# Patient Record
Sex: Female | Born: 1985 | Race: White | Hispanic: No | Marital: Married | Smoking: Never smoker
Health system: Southern US, Community
[De-identification: ages and names within clinical notes are randomized; demographics above are authoritative.]

## PROBLEM LIST (undated history)

## (undated) ENCOUNTER — Inpatient Hospital Stay (HOSPITAL_COMMUNITY): Payer: Self-pay

## (undated) DIAGNOSIS — R87619 Unspecified abnormal cytological findings in specimens from cervix uteri: Secondary | ICD-10-CM

## (undated) DIAGNOSIS — R87629 Unspecified abnormal cytological findings in specimens from vagina: Secondary | ICD-10-CM

## (undated) DIAGNOSIS — K589 Irritable bowel syndrome without diarrhea: Secondary | ICD-10-CM

## (undated) DIAGNOSIS — F419 Anxiety disorder, unspecified: Secondary | ICD-10-CM

## (undated) DIAGNOSIS — O2620 Pregnancy care for patient with recurrent pregnancy loss, unspecified trimester: Secondary | ICD-10-CM

## (undated) HISTORY — DX: Pregnancy care for patient with recurrent pregnancy loss, unspecified trimester: O26.20

## (undated) HISTORY — DX: Anxiety disorder, unspecified: F41.9

## (undated) HISTORY — PX: WISDOM TOOTH EXTRACTION: SHX21

## (undated) HISTORY — DX: Unspecified abnormal cytological findings in specimens from cervix uteri: R87.619

## (undated) NOTE — H&P (Signed)
 Formatting of this note is different from the original. OB/GYN Labor and Delivery H&P  Patient Name: Tara Vargas [birth year] Medical Record Number: [id number] Date: 09/23/2022   Time: 23:21   Room/Bed: 3114/3114-1  Chief complaint:      Chief Complaint  Patient presents with  ? Cold/Flu-like Symptoms  ? Contractions  ? Nausea  ? Emesis During Pregnancy   HPI:     Joda Braatz is a 66 y.o. [id number] with an Estimated Date of Delivery: 11/25/22 at [redacted]w[redacted]d by us  presenting to triage for nausea/vomiting, uterine contractions, and fever. Pt states temp at home was 101. She took tylenol  but did vomit after. She also c/o uterine cramping. Reports family has had a stomach bug this week and had similar symptoms.   ROS:    Review of 10 organ systems negative except as noted as above  Prenatal care provider:  [facility]  OBHx:   Rh negative, Depression/anxiety, Elevated pregnancy BMI, IBS-C OB History  Gravida Para Term Preterm AB Living  6 2 2   3 2   SAB IAB Ectopic Molar Multiple Live Births  3         2   # Outcome Date GA Lbr Len/2nd Weight Sex Delivery Anes PTL Lv  6 Current           5 Term 05/07/15 [redacted]w[redacted]d  4423 g (9 lb 12 oz) M Vag-Spont None  LIV     Birth Comments: Water Birth  4 SAB 2015          3 SAB 2015          2 SAB 2014          1 Term 09/30/10 [redacted]w[redacted]d  4139 g (9 lb 2 oz) F Vag-Forceps EPI  LIV     Complications: Fourth degree perineal laceration   GynHx:     Problem list: There is no problem list on file for this patient.  PMHx:    Past Medical History:  Diagnosis Date  ? Anemia   ? Anxiety    PSHx:    Past Surgical History:  Procedure Laterality Date  ? WISDOM TOOTH EXTRACTION     Meds:    No current facility-administered medications on file prior to encounter.   Current Outpatient Medications on File Prior to Encounter  Medication Sig Dispense Refill  ? doxylamine succinate (UNISOM, DOXYLAMINE, ORAL) Take 0.5 tablets by mouth daily.    ?  ferrous sulfate 325 (65 FE) MG tablet Take 325 mg by mouth daily with breakfast.    ? PNV cmb#95-ferrous fumarate-FA (PRENAVITE) 28 mg iron- 800 mcg Tab Take by mouth daily.    ? pyridoxine, vitamin B6, (B-6) 25 MG tablet Take 25 mg by mouth daily.    ? venlafaxine er (EFFEXOR-XR) 150 MG 24 hr capsule Take 150 mg by mouth daily.     Allergies:    Allergies  Allergen Reactions  ? Amoxicillin Hives  ? Morphine Other (See Comments)    Childhood reaction   FamHx:   No family history on file.  SocHx:    Social History   Tobacco Use  ? Smoking status: Never  ? Smokeless tobacco: Not on file  Substance Use Topics  ? Alcohol use: Not Currently   Immunization Hx:  Immunization History  Administered Date(s) Administered  ? Rho (D) Immune Globulin  04/13/2022   OBJECTIVE:    BP 106/58   Pulse 103   Temp 98.5 F (36.9 C) (Oral)   Resp 16  There is no height or weight on file to calculate BMI.  Gen-     NAD, AAO HEENT-   [place]/AT CV-   Regular rate PULM-   Non-labored ABD-   Soft, gravid, non-tender, non-distended Ext-   No lower extremity edema Neuro-   GU-   Normal appearing external genitalia without lesions  Sterile Speculum Exam: Vaginal mucosa pink without lesions, cervix without lesions Cervical Exam:   closed  OB Exam FHTs- CAT 1 TOCO- q3-4 mins mild  Prenatal Labs:    ABO/Rh (External)  Date Value Ref Range Status  06/01/2022 O Negative  Final   Rubella Immune Status - External  Date Value Ref Range Status  06/01/2022 Immune  Final   RPR - External  Date Value Ref Range Status  06/01/2022 Nonreactive  Final   Hepatitis B Screen - External  Date Value Ref Range Status  06/01/2022 Nonreactive  Final   HIV Antibody - External  Date Value Ref Range Status  06/01/2022 Nonreactive  Final   [facility] GBS Result:  Recent Procedure Details   No resulted procedures found.   Radiology:   Labs:   Preeclampsia Panel    A/P:    [id number] [redacted]w[redacted]d  Nausea/vomiting Afebrile on admission. Uterine irritability noted. Cervix closed.  Start LR bolus, preE panel, zofran  for n/v Disposition: Discharge home with PTL precautions and fmc. F/U in office as scheduled. Encouraged PO fluids.   D/w Dr. Dow  Risks, benefits, alternatives and possible complications have been discussed in detail with the patient. admission,  procedures and expectations were discussed in detail.   Electronically signed: POWELL JINNY BLUSH, CNM 09/23/2022 23:21   Electronically signed by Nat GORMAN Dow, MD at 09/24/2022  6:35 AM EDT

---
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
## 2004-01-24 ENCOUNTER — Other Ambulatory Visit: Admission: RE | Admit: 2004-01-24 | Discharge: 2004-01-24 | Payer: Self-pay | Admitting: Obstetrics and Gynecology

## 2004-05-29 ENCOUNTER — Emergency Department (HOSPITAL_COMMUNITY): Admission: EM | Admit: 2004-05-29 | Discharge: 2004-05-29 | Payer: Self-pay | Admitting: Emergency Medicine

## 2005-02-03 ENCOUNTER — Other Ambulatory Visit: Admission: RE | Admit: 2005-02-03 | Discharge: 2005-02-03 | Payer: Self-pay | Admitting: Obstetrics and Gynecology

## 2005-08-10 ENCOUNTER — Emergency Department (HOSPITAL_COMMUNITY): Admission: EM | Admit: 2005-08-10 | Discharge: 2005-08-10 | Payer: Self-pay | Admitting: Emergency Medicine

## 2005-11-16 IMAGING — CT CT RECONSTRUCTION
4 of 5 series · 16 of 33 positions shown, 18 images · IV contrast (agent unspecified)
Comparison: none

CLINICAL DATA: Fell down steps. Please evaluate T-8 and T-9 fractures.
TECHNIQUE: Axial scans were obtained from the level of the superior border of T-6 through the T-10 pedicular level.  Also, sagittal and coronal reformations were performed. 
 CT THORACIC SPINE LIMITED WITHOUT CONTRAST:
 There are superior end plate compression fractures associated with the T-8 and T-9 vertebral bodies with approximately 10 to 15 percent depression of the superior end plates of the T-8 and T-9 vertebrae.  
 There is no evidence of posterior element fractures and there is no evidence for a burst-type fracture.  The bony spinal canal has a normal appearance.  The intervertebral discs are not well evaluated on this standard CT of the thoracic spine.  There are no destructive changes.

[Series 3: recon 2: · axial · 0.33mm/px · z∈[-191,-135]mm · 4 of 75 slices shown, 5 images]
[im 15/75  soft-tissue]
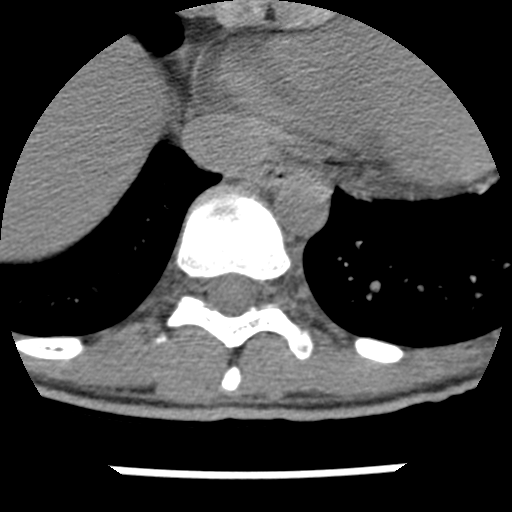
[im 15/75  bone]
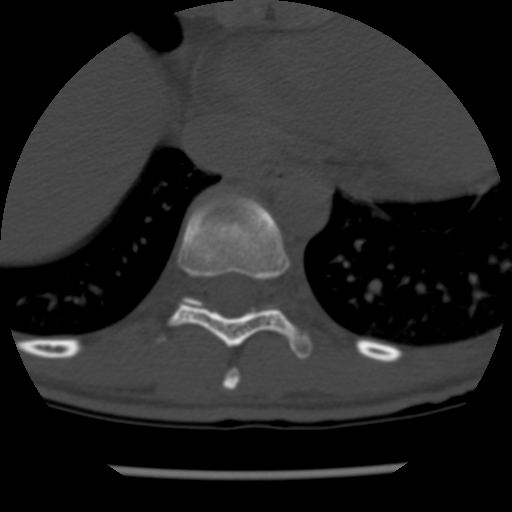
[im 30/75  bone]
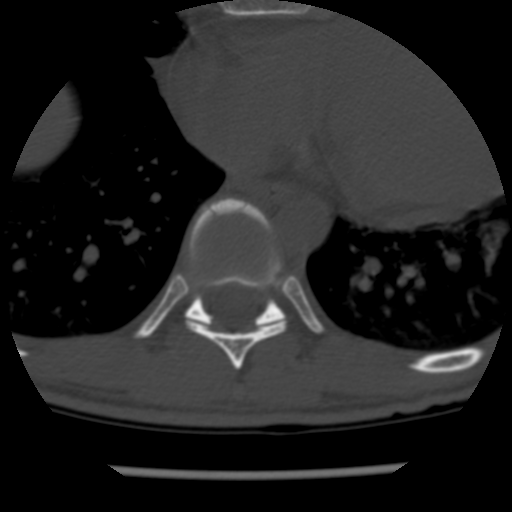
[im 45/75  bone]
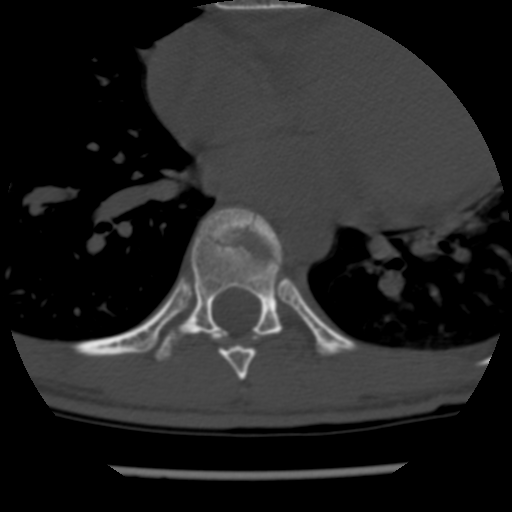
[im 60/75  bone]
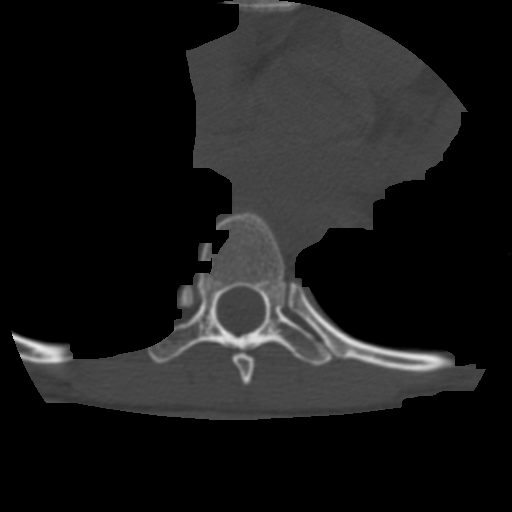

[Series 102: — · axial · 0.33mm/px · z∈[-191,-135]mm · 4 of 76 slices shown]
[im 16/76  bone]
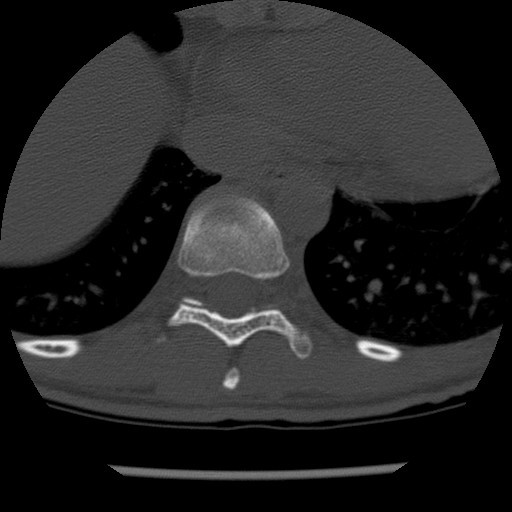
[im 31/76  bone]
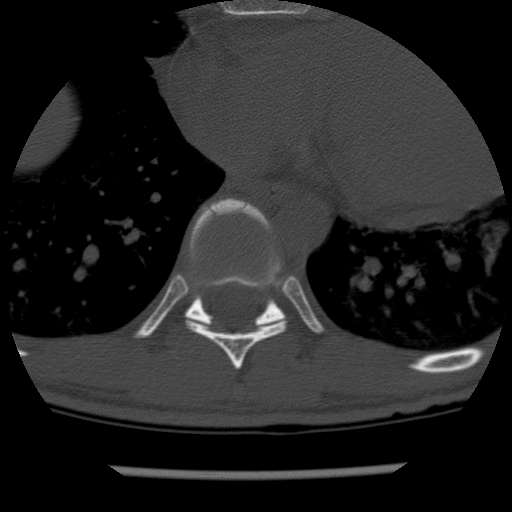
[im 46/76  bone]
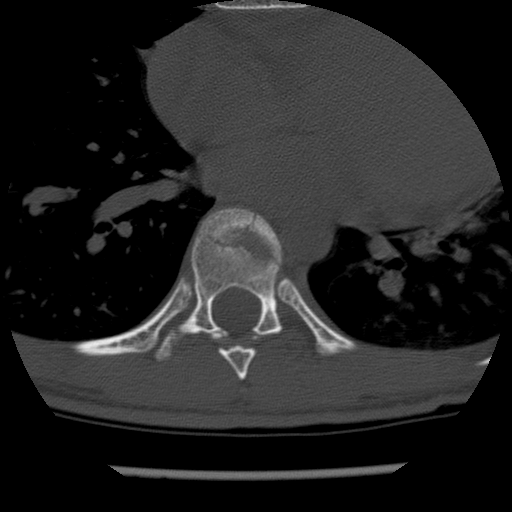
[im 61/76  bone]
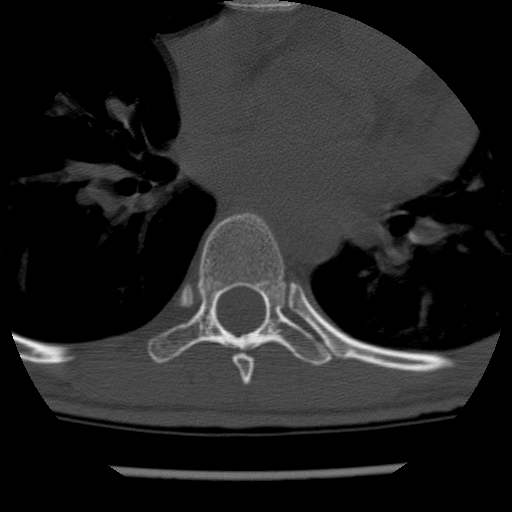

[Series 103: reformatted · sagittal · 0.33mm/px · 3 of 33 slices shown (1 of 2)]
[im 7/33  bone]
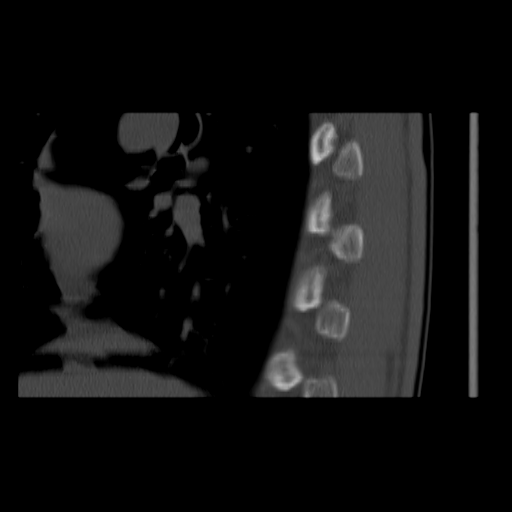
[im 13/33  bone]
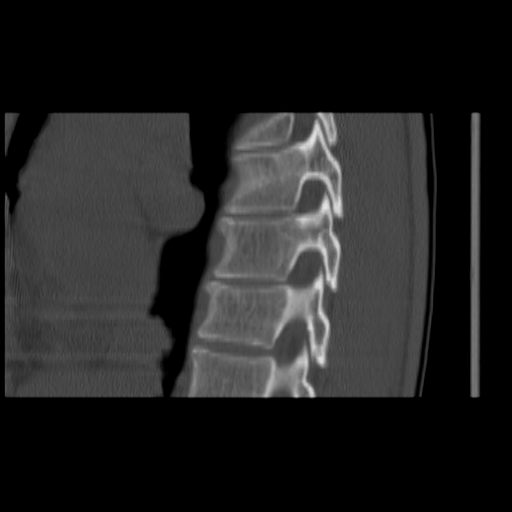
[im 20/33  bone]
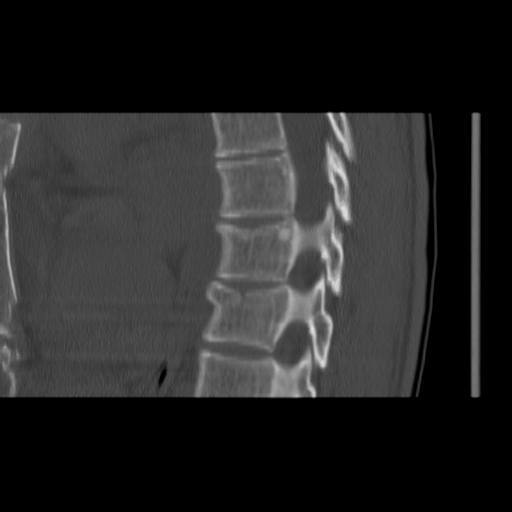

[Series 104: reformatted · coronal · 0.33mm/px · 5 of 33 slices shown, 6 images (2 of 2)]
[im 11/33  bone]
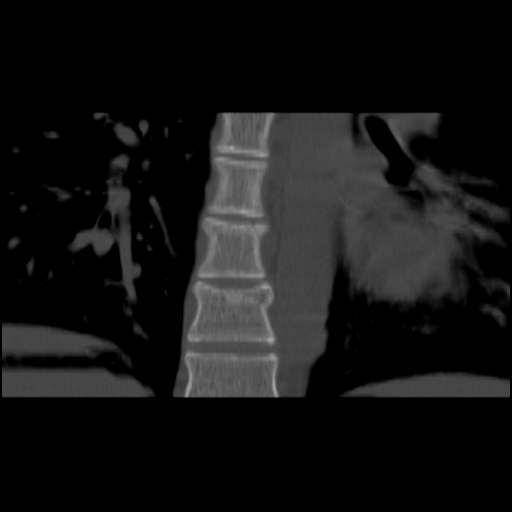
[im 14/33  bone]
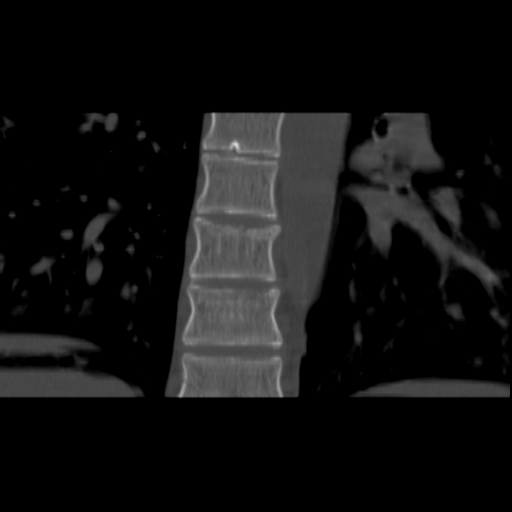
[im 17/33  soft-tissue]
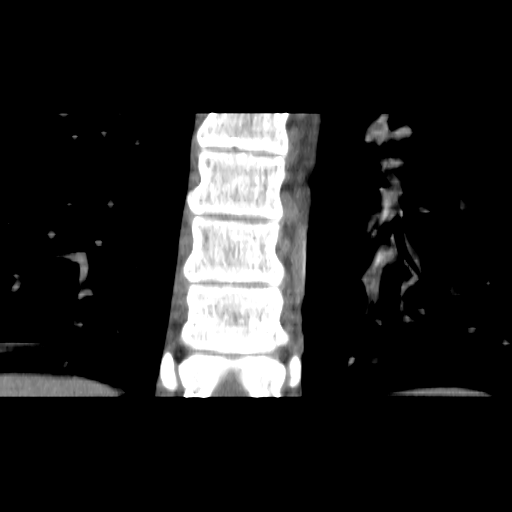
[im 17/33  bone]
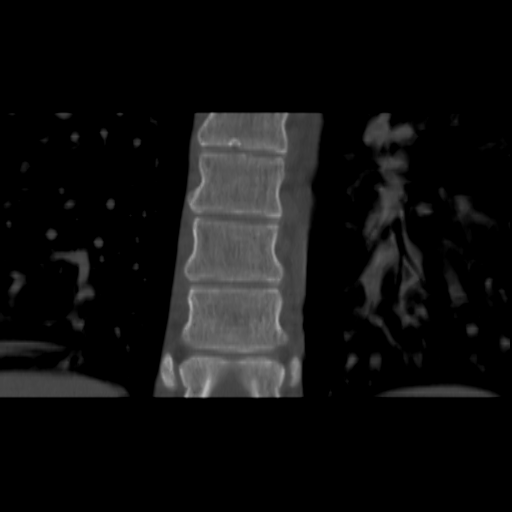
[im 19/33  bone]
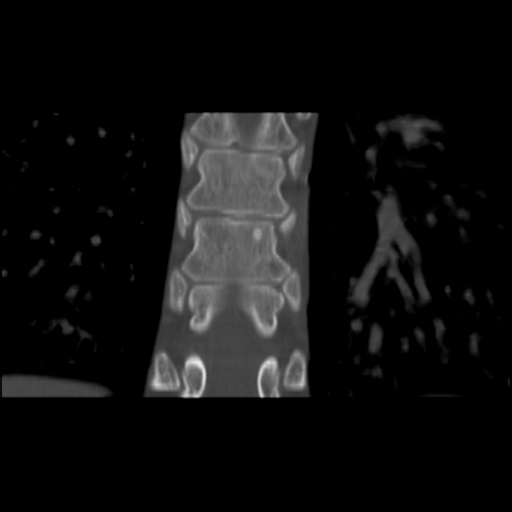
[im 22/33  bone]
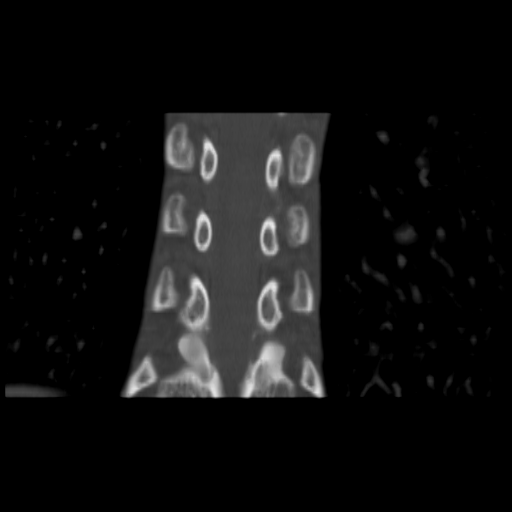

[16 of 33 positions shown; findings below may reference images not displayed]

IMPRESSION: Superior end plate compression fractures associated with the T-8 and T-9 vertebral bodies as discussed above.  No evidence for a burst-type fracture or a posterior element fractures.  The intervertebral discs are not well evaluated on this CT study (MR would be more sensitive for disc herniation).
 CT MULTIPLANAR RECONSTRUCTION OF THE THORACIC SPINE:
 Multiplanar reformatted CT images were reconstructed from the axial CT data set. These images were reviewed and pertinent findings are included in the accompanying complete CT report.
IMPRESSION: See complete CT report.

## 2006-02-04 ENCOUNTER — Other Ambulatory Visit: Admission: RE | Admit: 2006-02-04 | Discharge: 2006-02-04 | Payer: Self-pay | Admitting: Obstetrics & Gynecology

## 2007-02-07 ENCOUNTER — Other Ambulatory Visit: Admission: RE | Admit: 2007-02-07 | Discharge: 2007-02-07 | Payer: Self-pay | Admitting: Obstetrics & Gynecology

## 2008-09-03 ENCOUNTER — Other Ambulatory Visit: Admission: RE | Admit: 2008-09-03 | Discharge: 2008-09-03 | Payer: Self-pay | Admitting: Obstetrics and Gynecology

## 2009-01-02 ENCOUNTER — Emergency Department (HOSPITAL_COMMUNITY): Admission: EM | Admit: 2009-01-02 | Discharge: 2009-01-02 | Payer: Self-pay | Admitting: Emergency Medicine

## 2010-03-28 LAB — US OB COMP LESS 14 WKS: CRL: 6.49 cm

## 2010-05-12 LAB — US OB TRANSVAGINAL

## 2010-07-02 LAB — US OB TRANSVAGINAL

## 2010-08-18 ENCOUNTER — Inpatient Hospital Stay (HOSPITAL_COMMUNITY)
Admission: AD | Admit: 2010-08-18 | Discharge: 2010-08-18 | Disposition: A | Discharge status: Home / Self Care | Payer: BC Managed Care – PPO | Source: Ambulatory Visit | Attending: Obstetrics and Gynecology | Admitting: Obstetrics and Gynecology

## 2010-08-18 DIAGNOSIS — O9989 Other specified diseases and conditions complicating pregnancy, childbirth and the puerperium: Secondary | ICD-10-CM

## 2010-08-18 DIAGNOSIS — R51 Headache: Secondary | ICD-10-CM

## 2010-08-18 DIAGNOSIS — O99891 Other specified diseases and conditions complicating pregnancy: Secondary | ICD-10-CM | POA: Insufficient documentation

## 2010-08-18 LAB — URINALYSIS, ROUTINE W REFLEX MICROSCOPIC
Bilirubin Urine: NEGATIVE
Glucose, UA: NEGATIVE mg/dL
Hgb urine dipstick: NEGATIVE
Ketones, ur: NEGATIVE mg/dL
Nitrite: NEGATIVE
Protein, ur: NEGATIVE mg/dL
Specific Gravity, Urine: <1.005 — ABNORMAL LOW (ref 1.005–1.030)
Urobilinogen, UA: 0.2 mg/dL (ref 0.0–1.0)
pH: 6 (ref 5.0–8.0)

## 2010-09-21 LAB — POCT I-STAT, CHEM 8
BUN: 10 mg/dL (ref 6–23)
Calcium, Ion: 1.11 mmol/L — ABNORMAL LOW (ref 1.12–1.32)
Chloride: 104 mEq/L (ref 96–112)
Creatinine, Ser: 1 mg/dL (ref 0.4–1.2)
Glucose, Bld: 81 mg/dL (ref 70–99)
HCT: 43 % (ref 36.0–46.0)
Hemoglobin: 14.6 g/dL (ref 12.0–15.0)
Potassium: 3.8 mEq/L (ref 3.5–5.1)
Sodium: 138 mEq/L (ref 135–145)
TCO2: 22 mmol/L (ref 0–100)

## 2010-09-29 ENCOUNTER — Inpatient Hospital Stay (HOSPITAL_COMMUNITY)
Admission: AD | Admit: 2010-09-29 | Discharge: 2010-10-02 | DRG: 372 | Disposition: A | Discharge status: Home / Self Care | Payer: BC Managed Care – PPO | Source: Ambulatory Visit | Attending: Obstetrics & Gynecology | Admitting: Obstetrics & Gynecology

## 2010-09-29 LAB — CBC
HCT: 37.5 % (ref 36.0–46.0)
Hemoglobin: 12.5 g/dL (ref 12.0–15.0)
MCH: 31.1 pg (ref 26.0–34.0)
MCHC: 33.3 g/dL (ref 30.0–36.0)
MCV: 93.3 fL (ref 78.0–100.0)
Platelets: 273 10*3/uL (ref 150–400)
RBC: 4.02 MIL/uL (ref 3.87–5.11)
RDW: 14.2 % (ref 11.5–15.5)
WBC: 10.1 10*3/uL (ref 4.0–10.5)

## 2010-09-29 LAB — RPR: RPR Ser Ql: NONREACTIVE

## 2010-09-30 LAB — ABO/RH: ABO/RH(D): O NEG

## 2010-10-01 LAB — CBC
HCT: 34 % — ABNORMAL LOW (ref 36.0–46.0)
Hemoglobin: 10.9 g/dL — ABNORMAL LOW (ref 12.0–15.0)
MCH: 30.4 pg (ref 26.0–34.0)
MCHC: 32.1 g/dL (ref 30.0–36.0)
MCV: 95 fL (ref 78.0–100.0)
Platelets: 279 10*3/uL (ref 150–400)
RBC: 3.58 MIL/uL — ABNORMAL LOW (ref 3.87–5.11)
RDW: 14.5 % (ref 11.5–15.5)
WBC: 28.8 10*3/uL — ABNORMAL HIGH (ref 4.0–10.5)

## 2010-10-02 LAB — CBC
HCT: 28.7 % — ABNORMAL LOW (ref 36.0–46.0)
Hemoglobin: 9.3 g/dL — ABNORMAL LOW (ref 12.0–15.0)
MCH: 31.1 pg (ref 26.0–34.0)
MCHC: 32.4 g/dL (ref 30.0–36.0)
MCV: 96 fL (ref 78.0–100.0)
Platelets: 269 10*3/uL (ref 150–400)
RBC: 2.99 MIL/uL — ABNORMAL LOW (ref 3.87–5.11)
RDW: 14.5 % (ref 11.5–15.5)
WBC: 15.6 10*3/uL — ABNORMAL HIGH (ref 4.0–10.5)

## 2010-10-10 NOTE — Discharge Summary (Signed)
  [name], [name]              ACCOUNT NO.:  [id number]  MEDICAL RECORD NO.:  [id number]           PATIENT TYPE:  I  LOCATION:  9144                          FACILITY:  [facility]  PHYSICIAN:  Kestrel Mis H. Tenny Craw, MD     DATE OF BIRTH:  [birth year]  DATE OF ADMISSION:  09/29/2010 DATE OF DISCHARGE:  10/02/2010                              DISCHARGE SUMMARY   HOSPITAL DIAGNOSES: 1. A 39-week intrauterine pregnancy with spontaneous rupture of     membranes. 2. Low forceps assisted vaginal delivery of a female infant. 3. Fourth-degree perineal laceration.  HOSPITAL COURSE:  Ms. Tara Vargas is a 25 year old gravida 1, para 0 who presented at 44 weeks' estimated gestational age with spontaneous rupture of membranes.  Her prenatal course had been uncomplicated.  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  The MRI report was reviewed by Anesthesia prior to admission and she was felt to be suitable candidate for regional anesthesia.  She presented early on September 29, 2010, with spontaneous rupture of membranes for clear fluid. She was not contracting very frequently on her own and augmentation of labor was deemed necessary.  However, she preferred to avoid Pitocin and therefore the patient was allowed to proceed with nipple stimulation for augmentation of labor.  However, she did eventually require Pitocin augmentation.  The patient made it to complete and +2 station after pushing for 3 hours.  She, after an hour of pushing, did develop a temperature and did receive ampicillin.  After 3 hours of pushing, she did undergo a forceps-assisted vaginal delivery by Dr. Dareen Piano for delivery of a vigorous female infant, weighing 4105 g and 9 pounds 0 ounces with Apgar scores of 7 at 1 minute and 9 at 5 minutes.  She did experience a fourth-degree perineal laceration which was repaired in the usual fashion.  Postpartum, she did very well and by postpartum day #2, she was deemed ready for  discharge home.  She was receiving Ultram for pain medication.  She was taking Colace as a stool softener.  She was breast-feeding well.  She was instructed on perineal care for her fourth- degree laceration.  She will follow up in the office in 4 weeks.  DISCHARGE LABS:  White blood cell count 15.6, hemoglobin 9.3, platelets 269,000.     Freddrick March. Tenny Craw, MD     [facility]/MEDQ  D:  10/02/2010  T:  10/02/2010  Job:  [id number]  Electronically Signed by Waynard Reeds MD on 10/10/2010 10:46:56 AM

## 2013-09-27 ENCOUNTER — Encounter (HOSPITAL_COMMUNITY): Payer: Self-pay | Admitting: *Deleted

## 2013-09-27 ENCOUNTER — Inpatient Hospital Stay (HOSPITAL_COMMUNITY)
Admission: AD | Admit: 2013-09-27 | Discharge: 2013-09-27 | Disposition: A | Discharge status: Home / Self Care | Payer: BC Managed Care – PPO | Source: Ambulatory Visit | Attending: Obstetrics and Gynecology | Admitting: Obstetrics and Gynecology

## 2013-09-27 DIAGNOSIS — R109 Unspecified abdominal pain: Secondary | ICD-10-CM | POA: Insufficient documentation

## 2013-09-27 DIAGNOSIS — D649 Anemia, unspecified: Secondary | ICD-10-CM | POA: Insufficient documentation

## 2013-09-27 DIAGNOSIS — O99019 Anemia complicating pregnancy, unspecified trimester: Secondary | ICD-10-CM | POA: Insufficient documentation

## 2013-09-27 DIAGNOSIS — O200 Threatened abortion: Secondary | ICD-10-CM | POA: Insufficient documentation

## 2013-09-27 LAB — URINALYSIS, ROUTINE W REFLEX MICROSCOPIC
Bilirubin Urine: NEGATIVE
Glucose, UA: NEGATIVE mg/dL
Hgb urine dipstick: NEGATIVE
Ketones, ur: NEGATIVE mg/dL
Leukocytes, UA: NEGATIVE
Nitrite: NEGATIVE
Protein, ur: NEGATIVE mg/dL
Specific Gravity, Urine: <1.005 — ABNORMAL LOW (ref 1.005–1.030)
Urobilinogen, UA: 0.2 mg/dL (ref 0.0–1.0)
pH: 7.5 (ref 5.0–8.0)

## 2013-09-27 LAB — WET PREP, GENITAL
Clue Cells Wet Prep HPF POC: NONE SEEN
Trich, Wet Prep: NONE SEEN
Yeast Wet Prep HPF POC: NONE SEEN

## 2013-09-27 LAB — CBC
HEMATOCRIT: 39.2 % (ref 36.0–46.0)
Hemoglobin: 13.5 g/dL (ref 12.0–15.0)
MCH: 31.9 pg (ref 26.0–34.0)
MCHC: 34.4 g/dL (ref 30.0–36.0)
MCV: 92.7 fL (ref 78.0–100.0)
Platelets: 236 10*3/uL (ref 150–400)
RBC: 4.23 MIL/uL (ref 3.87–5.11)
RDW: 12.7 % (ref 11.5–15.5)
WBC: 6.6 10*3/uL (ref 4.0–10.5)

## 2013-09-27 LAB — HCG, QUANTITATIVE, PREGNANCY: hCG, Beta Chain, Quant, S: 19 m[IU]/mL — ABNORMAL HIGH (ref <5)

## 2013-09-27 NOTE — Discharge Instructions (Signed)
Threatened Miscarriage   A threatened miscarriage is a pregnancy that may end. It may be marked by bleeding during the first 20 weeks of pregnancy. Often, the pregnancy can continue without any more problems. You may be asked to stop:  · Having sex (intercourse).  · Having orgasms.  · Using tampons.  · Exercising.  · Doing heavy physical activity and work.  HOME CARE   · Your doctor may tell you to take bed rest and to stop activities and work.  · Write down the number of pads you use each day. Write down how often you change pads. Write down how soaked they are.  · Follow your doctor's advice for follow-up visits and tests.  · If your blood type is Rh-negative and the father's blood is Rh-positive (or is not known), you may get a shot to protect the baby.  · If you have a miscarriage, save all the tissue you pass in a container. Take the container to your doctor.  GET HELP RIGHT AWAY IF:   · You have bad cramps or pain in your belly (abdomen), lower belly, or back.  · You have a fever or chills.  · Your bleeding gets worse or you pass large clots of blood or tissue. Save this tissue to show your doctor.  · You feel lightheaded, weak, dizzy, or pass out (faint).  · You have a gush of fluid from your vagina.  MAKE SURE YOU:   · Understand these instructions.  · Will watch your condition.  · Will get help right away if you are not doing well or get worse.  Document Released: 05/14/2008 Document Revised: 08/24/2011 Document Reviewed: 06/17/2009  ExitCare® Patient Information ©2014 [employer].

## 2013-09-27 NOTE — MAU Note (Signed)
Hx of SAB x2, very tearful.  Has been cramping past few days.  Started spotting this morning.  First OB appt next Tues.

## 2013-09-27 NOTE — MAU Provider Note (Signed)
History     CSN: [id number]  Arrival date and time: 09/27/13 1034   None     Chief Complaint  Patient presents with  . Abdominal Pain  . Vaginal Bleeding   HPI Comments: Tara Vargas 28 y.o. 3060w2d [id number] presents to MAU with spotting started today and cramping for 4 days. Her pain is "4" on 1-10 scale. No recent intercourse. She has had 2 SABs in past. She had a 5 day menses starting March 2, and some irregular bleeding since then. She has a NOB visit planned with Dr Dareen PianoAnderson.      Past Medical History  Diagnosis Date  . Anemia     Past Surgical History  Procedure Laterality Date  . No past surgeries      Family History  Problem Relation Age of Onset  . Diabetes Mother   . Diabetes Father     History  Substance Use Topics  . Smoking status: Never Smoker   . Smokeless tobacco: Not on file  . Alcohol Use: No    Allergies:  Allergies  Allergen Reactions  . Amoxicillin Hives  . Morphine And Related Hives    Prescriptions prior to admission  Medication Sig Dispense Refill  . acetaminophen (TYLENOL) 325 MG tablet Take 650 mg by mouth every 6 (six) hours as needed for headache.      . calcium carbonate (TUMS - DOSED IN MG ELEMENTAL CALCIUM) 500 MG chewable tablet Chew 1 tablet by mouth as needed for indigestion or heartburn.      . Prenatal Vit-Fe Fumarate-FA (PRENATAL MULTIVITAMIN) TABS tablet Take 1 tablet by mouth daily at 12 noon.        Review of Systems  Constitutional: Negative.        Tearful  HENT: Negative.   Eyes: Negative.   Respiratory: Negative.   Cardiovascular: Negative.   Gastrointestinal: Positive for abdominal pain.  Genitourinary: Negative.        Spotting when wipes today only  Musculoskeletal: Negative.   Skin: Negative.   Neurological: Negative.   Psychiatric/Behavioral: Negative.    Physical Exam   Blood pressure 127/78, pulse 77, temperature 98.6 F (37 C), temperature source Oral, resp. rate 18, height 5' 4.5" (1.638  m), weight 76.204 kg (168 lb), last menstrual period 08/14/2013.  Physical Exam  Constitutional: She is oriented to person, place, and time. She appears well-developed and well-nourished. No distress.  HENT:  Head: Normocephalic and atraumatic.  Eyes: Pupils are equal, round, and reactive to light.  GI: Soft. She exhibits no distension and no mass. There is no tenderness. There is no rebound and no guarding.  Genitourinary:  Genital:external negative Vaginal:no bleeding, small amount white discharge Cervix:few little blood dots on cervix/ friable Bimanual:nontender   Musculoskeletal: Normal range of motion.  Neurological: She is alert and oriented to person, place, and time.  Skin: Skin is warm and dry.  Psychiatric: She has a normal mood and affect. Her behavior is normal. Judgment and thought content normal.   Results for orders placed during the hospital encounter of 09/27/13 (from the past 24 hour(s))  URINALYSIS, ROUTINE W REFLEX MICROSCOPIC     Status: Abnormal   Collection Time    09/27/13 10:48 AM      Result Value Ref Range   Color, Urine YELLOW  YELLOW   APPearance CLEAR  CLEAR   Specific Gravity, Urine <1.005 (*) 1.005 - 1.030   pH 7.5  5.0 - 8.0   Glucose, UA NEGATIVE  NEGATIVE  mg/dL   Hgb urine dipstick NEGATIVE  NEGATIVE   Bilirubin Urine NEGATIVE  NEGATIVE   Ketones, ur NEGATIVE  NEGATIVE mg/dL   Protein, ur NEGATIVE  NEGATIVE mg/dL   Urobilinogen, UA 0.2  0.0 - 1.0 mg/dL   Nitrite NEGATIVE  NEGATIVE   Leukocytes, UA NEGATIVE  NEGATIVE  HCG, QUANTITATIVE, PREGNANCY     Status: Abnormal   Collection Time    09/27/13 11:09 AM      Result Value Ref Range   hCG, Beta Chain, Quant, S 19 (*) <5 mIU/mL  WET PREP, GENITAL     Status: Abnormal   Collection Time    09/27/13 11:30 AM      Result Value Ref Range   Yeast Wet Prep HPF POC NONE SEEN  NONE SEEN   Trich, Wet Prep NONE SEEN  NONE SEEN   Clue Cells Wet Prep HPF POC NONE SEEN  NONE SEEN   WBC, Wet Prep HPF  POC FEW (*) NONE SEEN  CBC     Status: None   Collection Time    09/27/13 11:44 AM      Result Value Ref Range   WBC 6.6  4.0 - 10.5 K/uL   RBC 4.23  3.87 - 5.11 MIL/uL   Hemoglobin 13.5  12.0 - 15.0 g/dL   HCT 45.439.2  09.836.0 - 11.946.0 %   MCV 92.7  78.0 - 100.0 fL   MCH 31.9  26.0 - 34.0 pg   MCHC 34.4  30.0 - 36.0 g/dL   RDW 14.712.7  82.911.5 - 56.215.5 %   Platelets 236  150 - 400 K/uL   Blood Type O negative  MAU Course  Procedures  MDM  Called Dr Henderson CloudHorvath who advised repeat quant in 48 hours and if it is <19 give Rhogam. If it is >19 follow as early pregnancy  Assessment and Plan   A: Threatened Miscarriage Negative blood type  P: Miscarriage precautions Return to MAU in 48 hours for repeat quant if less than 19/ Give Rhogam Return to MAU if bleeding becomes worse before 2 days Call Dr Dareen PianoAnderson with other concerns  Delbert PhenixLinda M Medha Pippen 09/27/2013, 11:19 AM

## 2013-09-28 LAB — GC/CHLAMYDIA PROBE AMP
CT Probe RNA: NEGATIVE
GC Probe RNA: NEGATIVE

## 2013-09-29 ENCOUNTER — Inpatient Hospital Stay (HOSPITAL_COMMUNITY)
Admission: AD | Admit: 2013-09-29 | Discharge: 2013-09-29 | Disposition: A | Discharge status: Home / Self Care | Payer: BC Managed Care – PPO | Source: Ambulatory Visit | Attending: Obstetrics & Gynecology | Admitting: Obstetrics & Gynecology

## 2013-09-29 DIAGNOSIS — O039 Complete or unspecified spontaneous abortion without complication: Secondary | ICD-10-CM

## 2013-09-29 DIAGNOSIS — O200 Threatened abortion: Secondary | ICD-10-CM | POA: Insufficient documentation

## 2013-09-29 DIAGNOSIS — O360990 Maternal care for other rhesus isoimmunization, unspecified trimester, not applicable or unspecified: Secondary | ICD-10-CM | POA: Insufficient documentation

## 2013-09-29 LAB — HCG, QUANTITATIVE, PREGNANCY: hCG, Beta Chain, Quant, S: 6 m[IU]/mL — ABNORMAL HIGH (ref <5)

## 2013-09-29 MED ORDER — RHO D IMMUNE GLOBULIN 1500 UNIT/2ML IJ SOLN
300.0000 ug | Freq: Once | INTRAMUSCULAR | Status: AC
  Administered 2013-09-29: 300 ug via INTRAMUSCULAR
  Filled 2013-09-29: qty 2

## 2013-09-29 NOTE — Discharge Instructions (Signed)
Miscarriage  A miscarriage is the loss of an unborn baby (fetus) before the 20th week of pregnancy. The cause is often unknown.   HOME CARE  · You may need to stay in bed (bed rest), or you may be able to do light activity. Go about activity as told by your doctor.  · Have help at home.  · Write down how many pads you use each day. Write down how soaked they are.  · Do not use tampons. Do not wash out your vagina (douche) or have sex (intercourse) until your doctor approves.  · Only take medicine as told by your doctor.  · Do not take aspirin.  · Keep all doctor visits as told.  · If you or your partner have problems with grieving, talk to your doctor. You can also try counseling. Give yourself time to grieve before trying to get pregnant again.  GET HELP RIGHT AWAY IF:  · You have bad cramps or pain in your back or belly (abdomen).  · You have a fever.  · You pass large clumps of blood (clots) from your vagina that are walnut-sized or larger. Save the clumps for your doctor to see.  · You pass large amounts of tissue from your vagina. Save the tissue for your doctor to see.  · You have more bleeding.  · You have thick, bad-smelling fluid (discharge) coming from the vagina.  · You get lightheaded, weak, or you pass out (faint).  · You have chills.  MAKE SURE YOU:  · Understand these instructions.  · Will watch your condition.  · Will get help right away if you are not doing well or get worse.  Document Released: 08/24/2011 Document Reviewed: 08/24/2011  ExitCare® Patient Information ©2014 [employer].

## 2013-09-29 NOTE — MAU Provider Note (Signed)
Discussed case with CNM and I agree with above  Essie HartWalda Quintella Mura

## 2013-09-29 NOTE — MAU Provider Note (Signed)
History     CSN: [id number]  Arrival date and time: 09/29/13 0934   None     Chief Complaint  Patient presents with  . Follow-up   HPI This is a 28 y.o. female at 6212w4d who presents for followup Quant HCG. She was seen two days ago for bleeding and Quant was found to be 19. She was instructed to repeat Quant today. Has small to mod. bleedng but no cramping  Previous MAU Note: A: Threatened Miscarriage  Negative blood type  P: Miscarriage precautions  Return to MAU in 48 hours for repeat quant if less than 19/ Give Rhogam  Return to MAU if bleeding becomes worse before 2 days  Call Dr Dareen PianoAnderson with other concerns  OB History   Grav Para Term Preterm Abortions TAB SAB Ect Mult Living   4 1 1  2  2   1       Past Medical History  Diagnosis Date  . Anemia     Past Surgical History  Procedure Laterality Date  . No past surgeries      Family History  Problem Relation Age of Onset  . Diabetes Mother   . Diabetes Father     History  Substance Use Topics  . Smoking status: Never Smoker   . Smokeless tobacco: Not on file  . Alcohol Use: No    Allergies:  Allergies  Allergen Reactions  . Amoxicillin Hives  . Morphine And Related Hives    Prescriptions prior to admission  Medication Sig Dispense Refill  . acetaminophen (TYLENOL) 325 MG tablet Take 650 mg by mouth every 6 (six) hours as needed for headache.      . calcium carbonate (TUMS - DOSED IN MG ELEMENTAL CALCIUM) 500 MG chewable tablet Chew 1 tablet by mouth as needed for indigestion or heartburn.      . Prenatal Vit-Fe Fumarate-FA (PRENATAL MULTIVITAMIN) TABS tablet Take 1 tablet by mouth daily at 12 noon.        Review of Systems  Constitutional: Negative for fever, chills and malaise/fatigue.  Gastrointestinal: Negative for nausea, vomiting and abdominal pain.  Genitourinary:       Vaginal bleeding   Neurological: Negative for dizziness.  Psychiatric/Behavioral: The patient is nervous/anxious (Due  to multiple losses).    Physical Exam   Blood pressure 127/85, pulse 81, temperature 98.7 F (37.1 C), resp. rate 16, last menstrual period 08/14/2013, SpO2 100.00%.  Physical Exam  Constitutional: She is oriented to person, place, and time. She appears well-developed and well-nourished. No distress.  HENT:  Head: Normocephalic.  Cardiovascular: Normal rate.   Respiratory: Effort normal.  Genitourinary:  Exam not indicated  Musculoskeletal: Normal range of motion.  Neurological: She is alert and oriented to person, place, and time.  Skin: Skin is warm and dry.  Psychiatric: She has a normal mood and affect.    MAU Course  Procedures  MDM Results for orders placed during the hospital encounter of 09/29/13 (from the past 24 hour(s))  HCG, QUANTITATIVE, PREGNANCY     Status: Abnormal   Collection Time    09/29/13  9:45 AM      Result Value Ref Range   hCG, Beta Chain, Quant, S 6 (*) <5 mIU/mL  RH IG WORKUP (INCLUDES ABO/RH)     Status: None   Collection Time    09/29/13  9:45 AM      Result Value Ref Range   Gestational Age(Wks) 6     ABO/RH(D) O NEG  Antibody Screen NEG     Unit Number [id number]     Blood Component Type RHIG     Unit division 00     Status of Unit ISSUED     Transfusion Status OK TO TRANSFUSE       Assessment and Plan  A:  Inevitable SAB       Rh Negative  P:  Discussed with Dr Mora ApplPinn       Rhophylac today       Pt will call office for followup   Aviva SignsMarie L Delante Karapetyan 09/29/2013, 10:10 AM

## 2013-09-29 NOTE — MAU Note (Signed)
Patient to MAU for follow up BHCG. Patient states she is bleeding scant to moderate but no pain.

## 2013-09-30 LAB — RH IG WORKUP (INCLUDES ABO/RH)
ABO/RH(D): O NEG
ANTIBODY SCREEN: NEGATIVE
Gestational Age(Wks): 6
UNIT DIVISION: 0

## 2013-10-17 ENCOUNTER — Ambulatory Visit (INDEPENDENT_AMBULATORY_CARE_PROVIDER_SITE_OTHER): Payer: BC Managed Care – PPO | Admitting: Obstetrics & Gynecology

## 2013-10-17 ENCOUNTER — Encounter: Payer: Self-pay | Admitting: Obstetrics & Gynecology

## 2013-10-17 VITALS — BP 114/78 | HR 74 | Resp 16 | Ht 65.0 in | Wt 170.0 lb

## 2013-10-17 DIAGNOSIS — N96 Recurrent pregnancy loss: Secondary | ICD-10-CM

## 2013-10-17 NOTE — Progress Notes (Signed)
   Subjective:    Patient ID: Tara PulsSusanna P Vargas, female    DOB: [birth year], 28 y.o.   MRN: [id number]  HPI  28 yo MW [id number] (263 yo daughter) here with 3 early pregnancy losses (all less than 6 weeks, [facility] only [facility] and [facility] Her last miscarriage was about a month ago. She has not had a period since then. She is using condoms prn. Her husband is her daughter's father.  Review of Systems     Objective:   Physical Exam   n/a     Assessment & Plan:  Chromosomal analysis of both parents and refer to Fermin Schwabamer Yalcinkaya, RE

## 2013-10-17 NOTE — Addendum Note (Signed)
Addended by: Granville LewisLARK, Yevette Knust L on: 10/17/2013 03:52 PM   Modules accepted: Orders

## 2013-10-31 LAB — CHROMOSOME ANALYSIS, PERIPHERAL BLOOD

## 2013-11-08 ENCOUNTER — Encounter: Payer: BC Managed Care – PPO | Admitting: Obstetrics & Gynecology

## 2014-02-21 ENCOUNTER — Encounter: Payer: Self-pay | Admitting: Obstetrics & Gynecology

## 2014-02-21 ENCOUNTER — Ambulatory Visit (INDEPENDENT_AMBULATORY_CARE_PROVIDER_SITE_OTHER): Payer: BC Managed Care – PPO | Admitting: Obstetrics & Gynecology

## 2014-02-21 VITALS — BP 124/71 | HR 95 | Resp 16 | Ht 65.0 in | Wt 158.0 lb

## 2014-02-21 DIAGNOSIS — Z3009 Encounter for other general counseling and advice on contraception: Secondary | ICD-10-CM | POA: Diagnosis not present

## 2014-02-21 DIAGNOSIS — Z01419 Encounter for gynecological examination (general) (routine) without abnormal findings: Secondary | ICD-10-CM

## 2014-02-21 DIAGNOSIS — Z113 Encounter for screening for infections with a predominantly sexual mode of transmission: Secondary | ICD-10-CM

## 2014-02-21 DIAGNOSIS — Z1151 Encounter for screening for human papillomavirus (HPV): Secondary | ICD-10-CM | POA: Diagnosis not present

## 2014-02-21 DIAGNOSIS — Z124 Encounter for screening for malignant neoplasm of cervix: Secondary | ICD-10-CM | POA: Diagnosis not present

## 2014-02-21 DIAGNOSIS — Z0000 Encounter for general adult medical examination without abnormal findings: Secondary | ICD-10-CM

## 2014-02-21 MED ORDER — NORGESTREL-ETHINYL ESTRADIOL 0.3-30 MG-MCG PO TABS: 1.0000 | ORAL_TABLET | Freq: Every day | ORAL | Status: DC

## 2014-02-21 NOTE — Progress Notes (Signed)
Subjective:    Tara Vargas is a 28 y.o. female who presents for an annual exam. The patient has no complaints today. She would like to start contraception for future use.She has irregular periods generally, but regular recently.  Nuvaring gave her migraines in the past.  She wants to try OCPs. She was on the OCP for 10 years happily.The patient is not currently sexually active. GYN screening history: last pap: was normal. The patient wears seatbelts: yes. The patient participates in regular exercise: yes. Has the patient ever been transfused or tattooed?: yes. The patient reports that there is not domestic violence in her life.   Menstrual History: OB History   Grav Para Term Preterm Abortions TAB SAB Ect Mult Living   Menarche age: 65  Patient's last menstrual period was 02/05/2014.    The following portions of the patient's history were reviewed and updated as appropriate: allergies, current medications, past family history, past medical history, past social history, past surgical history and problem list.  Review of Systems A comprehensive review of systems was negative. Separated for 6 weeks. Declines flu vaccine today. 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   Objective:    BP 124/71  Pulse 95  Resp 16  Ht  (1.651 m)  Wt 158 lb (71.668 kg)  BMI 26.29 kg/m2  LMP 02/05/2014  Breastfeeding? No  General Appearance:    Alert, cooperative, no distress, appears stated age  Head:    Normocephalic, without obvious abnormality, atraumatic  Eyes:    PERRL, conjunctiva/corneas clear, EOM's intact, fundi    benign, both eyes  Ears:    Normal TM's and external ear canals, both ears  Nose:   Nares normal, septum midline, mucosa normal, no drainage    or sinus tenderness  Throat:   Lips, mucosa, and tongue normal; teeth and gums normal  Neck:   Supple, symmetrical, trachea midline, no adenopathy;    thyroid:  no enlargement/tenderness/nodules; no carotid   bruit or JVD   Back:     Symmetric, no curvature, ROM normal, no CVA tenderness  Lungs:     Clear to auscultation bilaterally, respirations unlabored  Chest Wall:    No tenderness or deformity   Heart:    Regular rate and rhythm, S1 and S2 normal, no murmur, rub   or gallop  Breast Exam:    No tenderness, masses, or nipple abnormality  Abdomen:     Soft, non-tender, bowel sounds active all four quadrants,    no masses, no organomegaly  Genitalia:    Normal female without lesion, discharge or tenderness, NSSR, NT, mobile, normal adnexal exam     Extremities:   Extremities normal, atraumatic, no cyanosis or edema  Pulses:   2+ and symmetric all extremities  Skin:   Skin color, texture, turgor normal, no rashes or lesions  Lymph nodes:   Cervical, supraclavicular, and axillary nodes normal  Neurologic:   CNII-XII intact, normal strength, sensation and reflexes    throughout  .    Assessment:    Healthy female exam.    Plan:     Breast self exam technique reviewed and patient encouraged to perform self-exam monthly. Chlamydia specimen. GC specimen. Thin prep Pap smear.  STI testing Lo ovral generic prescribed

## 2014-02-22 ENCOUNTER — Telehealth: Payer: Self-pay | Admitting: *Deleted

## 2014-02-22 LAB — HEPATITIS B SURFACE ANTIGEN: HEP B S AG: NEGATIVE

## 2014-02-22 LAB — RPR

## 2014-02-22 LAB — HIV ANTIBODY (ROUTINE TESTING W REFLEX): HIV: NONREACTIVE

## 2014-02-22 LAB — HEPATITIS C ANTIBODY: HCV AB: NEGATIVE

## 2014-02-22 NOTE — Telephone Encounter (Signed)
LM on voicemail of neg labs and copy mailed to pt's home address.

## 2014-02-23 LAB — CYTOLOGY - PAP

## 2014-04-03 ENCOUNTER — Telehealth: Payer: Self-pay | Admitting: *Deleted

## 2014-04-03 DIAGNOSIS — Z30011 Encounter for initial prescription of contraceptive pills: Secondary | ICD-10-CM

## 2014-04-03 MED ORDER — LEVONORGEST-ETH ESTRAD 91-DAY 0.15-0.03 &0.01 MG PO TABS: 1.0000 | ORAL_TABLET | Freq: Every day | ORAL | Status: DC

## 2014-04-03 NOTE — Telephone Encounter (Signed)
Pt called in stating she wanted to be on Seasonique OCP but wanted to have a cycle each month and wanted to know if there were alternatives. I spoke with Dr. Marice Potterove who [facility] take 1 week off each month to have a cycle then start back. Pt expressed understanding. Sent Rx to pharm per pt req

## 2014-04-16 ENCOUNTER — Encounter: Payer: Self-pay | Admitting: Obstetrics & Gynecology

## 2014-06-15 NOTE — L&D Delivery Note (Signed)
Delivery Note At 2:11 AM a viable female was delivered via Vaginal, Spontaneous Delivery, waterbirth  (Presentation: ; Occiput Anterior).  APGAR: 7, 9; weight pend  .   Placenta status: Intact, Spontaneous.  Cord: 3 vessels with the following complications: None.   Anesthesia: Local  Episiotomy: None Lacerations: 2nd degree;Perineal Suture Repair: 2.0 vicryl Est. Blood Loss (mL): 500  Mom to postpartum.  Baby to Couplet care / Skin to Skin.  Tawnya CrookHogan, Juanita Devincent Donovan 05/07/2015, 2:45 AM

## 2014-07-16 ENCOUNTER — Ambulatory Visit (INDEPENDENT_AMBULATORY_CARE_PROVIDER_SITE_OTHER): Payer: BLUE CROSS/BLUE SHIELD | Admitting: Family Medicine

## 2014-07-16 VITALS — BP 124/78 | HR 82 | Temp 98.7°F | Resp 16 | Ht 66.0 in | Wt 168.5 lb

## 2014-07-16 DIAGNOSIS — B356 Tinea cruris: Secondary | ICD-10-CM

## 2014-07-16 DIAGNOSIS — B3749 Other urogenital candidiasis: Secondary | ICD-10-CM

## 2014-07-16 MED ORDER — NYSTATIN 100000 UNIT/GM EX POWD: Freq: Four times a day (QID) | CUTANEOUS | Status: DC

## 2014-07-16 MED ORDER — TERBINAFINE HCL 250 MG PO TABS: 250.0000 mg | ORAL_TABLET | Freq: Every day | ORAL | Status: DC

## 2014-07-16 NOTE — Progress Notes (Signed)
Subjective:  This chart was scribed for Charmaine DownsEva S MD by Modena JanskyAlbert Thayil, ED Scribe. This patient was seen in room 8 and the patient's care was started at 7:06 PM.   Patient ID: Tara Vargas, female    DOB: 06-Aug-1985, 29 y.o.   MRN: [id number] Chief Complaint  Patient presents with  . Rash    x 4 weeks.  used antifugal cream but this did not help.     HPI  HPI Comments: Tara PulsSusanna P Lumpkin is a 29 y.o. female who presents to the [facility] complaining of rash.   She reports that she has had a rash for the past 4 weeks. She states that the rash started several days after the cleanup of colonoscopy. She states that she has used OTC antifungal cream without any relief. 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   She states that she has severe IBS with frequent diarrhea.   She reports that she had femara injection 2 weeks ago once, bit rash was present prior.   She reports that she is trying to conceive so she wants to make sure no medication interferes with this plan. She states that she has been on prenatal vitamins.   No past medical history on file. Allergies  Allergen Reactions  . Amoxicillin Hives  . Morphine And Related Hives   Current Outpatient Prescriptions on File Prior to Visit  Medication Sig Dispense Refill  . acetaminophen (TYLENOL) 325 MG tablet Take 650 mg by mouth every 6 (six) hours as needed for headache.    . Multiple Vitamin (MULTIVITAMIN) tablet Take 1 tablet by mouth daily.     No current facility-administered medications on file prior to visit.     Review of Systems  Skin: Positive for rash.      Objective:   Physical Exam  Constitutional: She is oriented to person, place, and time. She appears well-developed and well-nourished. No distress.  HENT:  Head: Normocephalic and atraumatic.  Neck: Neck supple.  Cardiovascular: Normal rate.   Pulmonary/Chest: Effort normal. No respiratory distress.    Musculoskeletal: Normal range of motion.  Neurological: She is alert and oriented to person, place, and time.  Skin: Skin is warm and dry.  Macular erythematous hyperpigmented rash along the superior gluteal crease spreading across both buttock approximately 4-5 inches in width and 5-6 inches in length. Central skin over gluteal crease is macerated. Satellite lesions surrounding over buttock.    Finger nails, tongue and oropharynx were normal. No other rashes noted.   Psychiatric: She has a normal mood and affect. Her behavior is normal.  Nursing note and vitals reviewed.  BP 124/78 mmHg  Pulse 82  Temp(Src) 98.7 F (37.1 C) (Oral)  Resp 16  Ht 5\' 6"  (1.676 m)  Wt 168 lb 8 oz (76.431 kg)  BMI 27.21 kg/m2  SpO2 100%  LMP 06/25/2013    Assessment & Plan:   Tinea cruris  Candidiasis of urogenital site  Meds ordered this encounter  Medications  . nystatin (MYCOSTATIN) powder    Sig: Apply topically 4 (four) times daily.    Dispense:  60 g    Refill:  1  . terbinafine (LAMISIL) 250 MG tablet    Sig: Take 1 tablet (250 mg total) by mouth daily.    Dispense:  10 tablet    Refill:  0    I personally performed the services described in this documentation, which was scribed in  my presence. The recorded information has been reviewed and considered, and addended by me as needed.  Norberto SorensonEva Winola Drum, MD MPH

## 2014-07-16 NOTE — Patient Instructions (Signed)
Cutaneous Candidiasis Cutaneous candidiasis is a condition in which there is an overgrowth of yeast (candida) on the skin. Yeast normally live on the skin, but in small enough numbers not to cause any symptoms. In certain cases, increased growth of the yeast may cause an actual yeast infection. This kind of infection usually occurs in areas of the skin that are constantly warm and moist, such as the armpits or the groin. Yeast is the most common cause of diaper rash in babies and in people who cannot control their bowel movements (incontinence). CAUSES  The fungus that most often causes cutaneous candidiasis is Candida albicans. Conditions that can increase the risk of getting a yeast infection of the skin include:  Obesity.  Pregnancy.  Diabetes.  Taking antibiotic medicine.  Taking birth control pills.  Taking steroid medicines.  Thyroid disease.  An iron or zinc deficiency.  Problems with the immune system. SYMPTOMS   Red, swollen area of the skin.  Bumps on the skin.  Itchiness. DIAGNOSIS  The diagnosis of cutaneous candidiasis is usually based on its appearance. Light scrapings of the skin may also be taken and viewed under a microscope to identify the presence of yeast. TREATMENT  Antifungal creams may be applied to the infected skin. In severe cases, oral medicines may be needed.  HOME CARE INSTRUCTIONS   Keep your skin clean and dry.  Maintain a healthy weight.  If you have diabetes, keep your blood sugar under control. SEEK IMMEDIATE MEDICAL CARE IF:  Your rash continues to spread despite treatment.  You have a fever, chills, or abdominal pain. Document Released: 02/17/2011 Document Revised: 08/24/2011 Document Reviewed: 02/17/2011 Exit[employer] Patient Information 2015 [employer]. This information is not intended to replace advice given to you by your health care provider. Make sure you discuss any questions you have with your health care provider.  

## 2014-09-18 ENCOUNTER — Ambulatory Visit (INDEPENDENT_AMBULATORY_CARE_PROVIDER_SITE_OTHER): Payer: BLUE CROSS/BLUE SHIELD | Admitting: Family Medicine

## 2014-09-18 VITALS — BP 120/70 | HR 79 | Temp 97.7°F | Resp 18 | Ht 66.0 in | Wt 175.8 lb

## 2014-09-18 DIAGNOSIS — L03116 Cellulitis of left lower limb: Secondary | ICD-10-CM

## 2014-09-18 DIAGNOSIS — B354 Tinea corporis: Secondary | ICD-10-CM | POA: Diagnosis not present

## 2014-09-18 MED ORDER — CEPHALEXIN 500 MG PO CAPS: 500.0000 mg | ORAL_CAPSULE | Freq: Three times a day (TID) | ORAL | Status: DC

## 2014-09-18 MED ORDER — KETOCONAZOLE 2 % EX CREA: 1.0000 "application " | TOPICAL_CREAM | Freq: Every day | CUTANEOUS | Status: DC

## 2014-09-18 NOTE — Progress Notes (Signed)
Patient ID: Tara Vargas, female   DOB: 1986/04/08, 29 y.o.   MRN: [id number]   This chart was scribed for Tara SidleKurt Lauenstein, MD by [facility]ad[facility] Abu Hashem, medical scribe at [facility].The patient was seen in exam room 08 and the patient's care was started at 6:59 PM.  Patient ID: Tara Vargas MRN: [id number], DOB: 1986/04/08, 29 y.o. Date of Encounter: 09/18/2014  Primary Physician: Pcp Not In System  Chief Complaint:  Chief Complaint  Patient presents with   Rash    Possible Ring warm, left ankle   ring worm    possible ring warm, left hand 2nd digit   HPI:  Tara [name] is a 29 y.o. female who presents to [facility] complaining of rash on her left ankle and possible ring warm in her left index finger. She originally cut her ankle shaving 1.5 week ago. She used neosporin and covered the area with a bandage. The rash on her ankle has gradually worsened since applying the neosporin. Pt is [redacted] weeks pregnant, this is her second and she has a 30 year old.. Pt [place] in the [employer] [place]  History reviewed. No pertinent past medical history.   Home Meds: Prior to Admission medications   Medication Sig Start Date End Date Taking? Authorizing Provider  acetaminophen (TYLENOL) 325 MG tablet Take 650 mg by mouth every 6 (six) hours as needed for headache.   Yes Historical Provider, MD  Multiple Vitamin (MULTIVITAMIN) tablet Take 1 tablet by mouth daily.   Yes Historical Provider, MD  nystatin (MYCOSTATIN) powder Apply topically 4 (four) times daily. Patient not taking: Reported on 09/18/2014 07/16/14   Sherren MochaEva N Shaw, MD  terbinafine (LAMISIL) 250 MG tablet Take 1 tablet (250 mg total) by mouth daily. Patient not taking: Reported on 09/18/2014 07/16/14   Sherren MochaEva N Shaw, MD   Allergies:  Allergies  Allergen Reactions   Amoxicillin Hives   Morphine And Related Hives   History   Social History   Marital Status: Married    Spouse Name: N/A     Number of Children: N/A   Years of Education: N/A   Occupational History   Not on file.   Social History Main Topics   Smoking status: Never Smoker    Smokeless tobacco: Not on file   Alcohol Use: No   Drug Use: No   Sexual Activity: Yes    Birth Control/ Protection: Condom, Abstinence   Other Topics Concern   Not on file   Social History Narrative    Review of Systems: Constitutional: negative for chills, fever, night sweats, weight changes, or fatigue  HEENT: negative for vision changes, hearing loss, congestion, rhinorrhea, ST, epistaxis, or sinus pressure Cardiovascular: negative for chest pain or palpitations Respiratory: negative for hemoptysis, wheezing, shortness of breath, or cough Abdominal: negative for abdominal pain, nausea, vomiting, diarrhea, or constipation Dermatological: positive for rash Neurologic: negative for headache, dizziness, or syncope All other systems reviewed and are otherwise negative with the exception to those above and in the HPI.  Physical Exam: Blood pressure 120/70, pulse 79, temperature 97.7 F (36.5 C), temperature source Oral, resp. rate 18, height 5\' 6"  (1.676 m), weight 175 lb 12.8 oz (79.742 kg), last menstrual period 07/25/2014, SpO2 100 %., Body mass index is 28.39 kg/(m^2). General: Well developed, well nourished, in no acute distress. Head: Normocephalic, atraumatic, eyes without discharge, sclera non-icteric, nares are without discharge. Bilateral auditory canals clear, TM's are  without perforation, pearly grey and translucent with reflective cone of light bilaterally. Oral cavity moist, posterior pharynx without exudate, erythema, peritonsillar abscess, or post nasal drip.  Neck: Supple. No thyromegaly. Full ROM. No lymphadenopathy. Lungs: Clear bilaterally to auscultation without wheezes, rales, or rhonchi. Breathing is unlabored. Heart: RRR with S1 S2. No murmurs, rubs, or gallops appreciated. Abdomen: Soft,  non-tender, non-distended with normoactive bowel sounds. No hepatomegaly. No rebound/guarding. No obvious abdominal masses. Msk:  Strength and tone normal for age. Extremities/Skin: Warm and dry. No clubbing or cyanosis. No edema. There is a 2 cm scaly, erythematous abrasion on her left ankle. Finger has an annular rash on the left index finger, middle phalanx, measured 1 cm by 1/2 cm. Neuro: Alert and oriented X 3. Moves all extremities spontaneously. Gait is normal. CNII-XII grossly in tact. Psych:  Responds to questions appropriately with a normal affect.    ASSESSMENT AND PLAN:  29 y.o. year old female with crusty healed superficial laceration and surrounding scaly skin left lateral malleolus with surrounding 3 cm faint erythema with tinea corporis left index finger This chart was scribed in my presence and reviewed by me personally.    ICD-9-CM ICD-10-CM   1. Tinea corporis 110.5 B35.4 ketoconazole (NIZORAL) 2 % cream     DISCONTINUED: ketoconazole (NIZORAL) 2 % cream  2. Cellulitis of left lower extremity 682.6 L03.116 cephALEXin (KEFLEX) 500 MG capsule     DISCONTINUED: cephALEXin (KEFLEX) 500 MG capsule     Signed, Tara Sidle, MD 09/18/2014 6:59 PM

## 2014-09-18 NOTE — Patient Instructions (Signed)
Cellulitis Cellulitis is an infection of the skin and the tissue beneath it. The infected area is usually red and tender. Cellulitis occurs most often in the arms and lower legs.  CAUSES  Cellulitis is caused by bacteria that enter the skin through cracks or cuts in the skin. The most common types of bacteria that cause cellulitis are staphylococci and streptococci. SIGNS AND SYMPTOMS   Redness and warmth.  Swelling.  Tenderness or pain.  Fever. DIAGNOSIS  Your health care provider can usually determine what is wrong based on a physical exam. Blood tests may also be done. TREATMENT  Treatment usually involves taking an antibiotic medicine. HOME CARE INSTRUCTIONS   Take your antibiotic medicine as directed by your health care provider. Finish the antibiotic even if you start to feel better.  Keep the infected arm or leg elevated to reduce swelling.  Apply a warm cloth to the affected area up to 4 times per day to relieve pain.  Take medicines only as directed by your health care provider.  Keep all follow-up visits as directed by your health care provider. SEEK MEDICAL CARE IF:   You notice red streaks coming from the infected area.  Your red area gets larger or turns dark in color.  Your bone or joint underneath the infected area becomes painful after the skin has healed.  Your infection returns in the same area or another area.  You notice a swollen bump in the infected area.  You develop new symptoms.  You have a fever. SEEK IMMEDIATE MEDICAL CARE IF:   You feel very sleepy.  You develop vomiting or diarrhea.  You have a general ill feeling (malaise) with muscle aches and pains. MAKE SURE YOU:   Understand these instructions.  Will watch your condition.  Will get help right away if you are not doing well or get worse. Document Released: 03/11/2005 Document Revised: 10/16/2013 Document Reviewed: 08/17/2011 [facility] Patient Information 2015 [place].  This information is not intended to replace advice given to you by your health care provider. Make sure you discuss any questions you have with your health care provider. Body Ringworm Ringworm (tinea corporis) is a fungal infection of the skin on the body. This infection is not caused by worms, but is actually caused by a fungus. Fungus normally lives on the top of your skin and can be useful. However, in the case of ringworms, the fungus grows out of control and causes a skin infection. It can involve any area of skin on the body and can spread easily from one person to another (contagious). Ringworm is a common problem for children, but it can affect adults as well. Ringworm is also often found in athletes, especially wrestlers who share equipment and mats.  CAUSES  Ringworm of the body is caused by a fungus called dermatophyte. It can spread by:  Touchingother people who are infected.  Touchinginfected pets.  Touching or sharingobjects that have been in contact with the infected person or pet (hats, combs, towels, clothing, sports equipment). SYMPTOMS   Itchy, raised red spots and bumps on the skin.  Ring-shaped rash.  Redness near the border of the rash with a clear center.  Dry and scaly skin on or around the rash. Not every person develops a ring-shaped rash. Some develop only the red, scaly patches. DIAGNOSIS  Most often, ringworm can be diagnosed by performing a skin exam. Your caregiver may choose to take a skin scraping from the affected area. The sample will  be examined under the microscope to see if the fungus is present.  TREATMENT  Body ringworm may be treated with a topical antifungal cream or ointment. Sometimes, an antifungal shampoo that can be used on your body is prescribed. You may be prescribed antifungal medicines to take by mouth if your ringworm is severe, keeps coming back, or lasts a long time.  HOME CARE INSTRUCTIONS   Only take over-the-counter or  prescription medicines as directed by your caregiver.  Wash the infected area and dry it completely before applying yourcream or ointment.  When using antifungal shampoo to treat the ringworm, leave the shampoo on the body for 3-5 minutes before rinsing.   Wear loose clothing to stop clothes from rubbing and irritating the rash.  Wash or change your bed sheets every night while you have the rash.  Have your pet treated by your veterinarian if it has the same infection. To prevent ringworm:   Practice good hygiene.  Wear sandals or shoes in public places and showers.  Do not share personal items with others.  Avoid touching red patches of skin on other people.  Avoid touching pets that have bald spots or wash your hands after doing so. SEEK MEDICAL CARE IF:   Your rash continues to spread after 7 days of treatment.  Your rash is not gone in 4 weeks.  The area around your rash becomes red, warm, tender, and swollen. Document Released: 05/29/2000 Document Revised: 02/24/2012 Document Reviewed: 12/14/2011 [facility] Patient Information 2015 [place], [place]. This information is not intended to replace advice given to you by your health care provider. Make sure you discuss any questions you have with your health care provider.

## 2014-09-22 ENCOUNTER — Inpatient Hospital Stay (HOSPITAL_COMMUNITY)
Admission: AD | Admit: 2014-09-22 | Discharge: 2014-09-22 | Disposition: A | Discharge status: Home / Self Care | Payer: BLUE CROSS/BLUE SHIELD | Source: Ambulatory Visit | Attending: Obstetrics & Gynecology | Admitting: Obstetrics & Gynecology

## 2014-09-22 ENCOUNTER — Encounter (HOSPITAL_COMMUNITY): Payer: Self-pay | Admitting: *Deleted

## 2014-09-22 ENCOUNTER — Inpatient Hospital Stay (HOSPITAL_COMMUNITY): Payer: BLUE CROSS/BLUE SHIELD

## 2014-09-22 DIAGNOSIS — O209 Hemorrhage in early pregnancy, unspecified: Secondary | ICD-10-CM | POA: Diagnosis not present

## 2014-09-22 DIAGNOSIS — Z3A09 9 weeks gestation of pregnancy: Secondary | ICD-10-CM | POA: Diagnosis not present

## 2014-09-22 DIAGNOSIS — Z3A01 Less than 8 weeks gestation of pregnancy: Secondary | ICD-10-CM | POA: Diagnosis not present

## 2014-09-22 DIAGNOSIS — O26851 Spotting complicating pregnancy, first trimester: Secondary | ICD-10-CM | POA: Diagnosis present

## 2014-09-22 LAB — URINALYSIS, ROUTINE W REFLEX MICROSCOPIC
Bilirubin Urine: NEGATIVE
GLUCOSE, UA: NEGATIVE mg/dL
HGB URINE DIPSTICK: NEGATIVE
KETONES UR: NEGATIVE mg/dL
Leukocytes, UA: NEGATIVE
NITRITE: NEGATIVE
PH: 6.5 (ref 5.0–8.0)
PROTEIN: NEGATIVE mg/dL
SPECIFIC GRAVITY, URINE: 1.015 (ref 1.005–1.030)
Urobilinogen, UA: 0.2 mg/dL (ref 0.0–1.0)

## 2014-09-22 LAB — CBC
HCT: 37.4 % (ref 36.0–46.0)
HEMOGLOBIN: 13 g/dL (ref 12.0–15.0)
MCH: 31.4 pg (ref 26.0–34.0)
MCHC: 34.8 g/dL (ref 30.0–36.0)
MCV: 90.3 fL (ref 78.0–100.0)
Platelets: 244 10*3/uL (ref 150–400)
RBC: 4.14 MIL/uL (ref 3.87–5.11)
RDW: 12.1 % (ref 11.5–15.5)
WBC: 9.2 10*3/uL (ref 4.0–10.5)

## 2014-09-22 LAB — HCG, QUANTITATIVE, PREGNANCY: hCG, Beta Chain, Quant, S: 85761 m[IU]/mL — ABNORMAL HIGH (ref <5)

## 2014-09-22 LAB — WET PREP, GENITAL
CLUE CELLS WET PREP: NONE SEEN
Trich, Wet Prep: NONE SEEN
Yeast Wet Prep HPF POC: NONE SEEN

## 2014-09-22 LAB — POCT PREGNANCY, URINE: Preg Test, Ur: POSITIVE — AB

## 2014-09-22 LAB — ABO/RH: ABO/RH(D): O NEG

## 2014-09-22 MED ORDER — RHO D IMMUNE GLOBULIN 1500 UNIT/2ML IJ SOSY
300.0000 ug | PREFILLED_SYRINGE | Freq: Once | INTRAMUSCULAR | Status: AC
  Administered 2014-09-22: 300 ug via INTRAMUSCULAR
  Filled 2014-09-22: qty 2

## 2014-09-22 NOTE — MAU Note (Signed)
Pt. States she has been cramping on and off but last night and this am noticed cramping that was worse. Also, noted to have light spotting when wiping when she used the restroom around 1500. Rates her pain a 5/10. Nausea noted. Pt. States she had an ultrasound yesterday with her reproductive endocrinologist [facility] that she sees. Has not seen an OB yet but was just released by Endo. Willi see Dr. Marice Potterove per pt.

## 2014-09-22 NOTE — Progress Notes (Signed)
Written and verbal d/c instructions given and understanding voiced. 

## 2014-09-22 NOTE — MAU Provider Note (Signed)
History     CSN: [id number]  Arrival date and time: 09/22/14 1627   First Provider Initiated Contact with Patient 09/22/14 1809      Chief Complaint  Patient presents with  . Vaginal Bleeding   HPI  Pt isG5P1SAB3@ [redacted]w[redacted]d pregnant who presents with cramping and spotting.  Pt has had multiple pregnancy losses- has seen Dr. Cornelius Moras with confirmation of viable pregnancy yesterday. 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 RN note: Sula Soda, RN Registered Nurse Signed  MAU Note 09/22/2014 5:53 PM    Expand All Collapse All   Pt. States she has been cramping on and off but last night and this am noticed cramping that was worse. Also, noted to have light spotting when wiping when she used the restroom around 1500. Rates her pain a 5/10. Nausea noted. Pt. States she had an ultrasound yesterday with her reproductive endocrinologist Dr. Cornelius Moras [facility]) that she sees. Has not seen an OB yet but was just released by Endo. Willi see Dr. Marice Potter per pt.            History reviewed. No pertinent past medical history.  Past Surgical History  Procedure Laterality Date  . No past surgeries      Family History  Problem Relation Age of Onset  . Diabetes Mother   . Diabetes Father     History  Substance Use Topics  . Smoking status: Never Smoker   . Smokeless tobacco: Not on file  . Alcohol Use: No    Allergies:  Allergies  Allergen Reactions  . Amoxicillin Hives  . Morphine And Related Hives    Prescriptions prior to admission  Medication Sig Dispense Refill Last Dose  . acetaminophen (TYLENOL) 325 MG tablet Take 650 mg by mouth every 6 (six) hours as needed for mild pain or headache.    09/22/2014 at 1100  . cephALEXin (KEFLEX) 500 MG capsule Take 1 capsule (500 mg total) by mouth 3 (three) times daily. 21 capsule 0 09/22/2014 at Unknown time  . Prenatal Vit-Fe Fumarate-FA (PRENATAL MULTIVITAMIN) TABS tablet Take 1 tablet by mouth  daily.   09/21/2014 at Unknown time  . Probiotic Product (PROBIOTIC DAILY PO) Take 2 capsules by mouth daily.    09/21/2014 at Unknown time  . ketoconazole (NIZORAL) 2 % cream Apply 1 application topically daily. (Patient not taking: Reported on 09/22/2014) 15 g 0   . nystatin (MYCOSTATIN) powder Apply topically 4 (four) times daily. (Patient not taking: Reported on 09/18/2014) 60 g 1 Not Taking  . terbinafine (LAMISIL) 250 MG tablet Take 1 tablet (250 mg total) by mouth daily. (Patient not taking: Reported on 09/18/2014) 10 tablet 0 Not Taking    Review of Systems  Constitutional: Negative for fever and chills.  Gastrointestinal: Positive for nausea, abdominal pain and constipation. Negative for diarrhea.  Genitourinary: Negative for dysuria and urgency.   Physical Exam   Blood pressure 112/64, temperature 98.4 F (36.9 C), temperature source Oral, resp. rate 18, height  (1.676 m), weight 175 lb 9.6 oz (79.652 kg), last menstrual period 07/25/2014.  Physical Exam  Nursing note and vitals reviewed. Constitutional: She is oriented to person, place, and time. She appears well-developed and well-nourished. No distress.  HENT:  Head: Normocephalic.  Eyes: Pupils are equal, round, and reactive to light.  Neck: Normal range of motion.  Cardiovascular: Normal rate.   Respiratory: Effort normal.  GI: Soft.  Genitourinary:  Vagina clean, cervix clean, closed-  no blood noted from cervix or in vault; uterus 6-8 week size retroverted; adnexa without palpable enlargement or tenderness  Musculoskeletal: Normal range of motion.  Neurological: She is alert and oriented to person, place, and time.  Skin: Skin is warm and dry.  Psychiatric: She has a normal mood and affect.    MAU Course  Procedures Results for orders placed or performed during the hospital encounter of 09/22/14 (from the past 48 hour(s))  Urinalysis, Routine w reflex microscopic     Status: None   Collection Time: 09/22/14  5:13 PM   Result Value Ref Range   Color, Urine YELLOW YELLOW   APPearance CLEAR CLEAR   Specific Gravity, Urine 1.015 1.005 - 1.030   pH 6.5 5.0 - 8.0   Glucose, UA NEGATIVE NEGATIVE mg/dL   Hgb urine dipstick NEGATIVE NEGATIVE   Bilirubin Urine NEGATIVE NEGATIVE   Ketones, ur NEGATIVE NEGATIVE mg/dL   Protein, ur NEGATIVE NEGATIVE mg/dL   Urobilinogen, UA 0.2 0.0 - 1.0 mg/dL   Nitrite NEGATIVE NEGATIVE   Leukocytes, UA NEGATIVE NEGATIVE    Comment: MICROSCOPIC NOT DONE ON URINES WITH NEGATIVE PROTEIN, BLOOD, LEUKOCYTES, NITRITE, OR GLUCOSE <1000 mg/dL.  Pregnancy, urine POC     Status: Abnormal   Collection Time: 09/22/14  5:23 PM  Result Value Ref Range   Preg Test, Ur POSITIVE (A) NEGATIVE    Comment:        THE SENSITIVITY OF THIS METHODOLOGY IS >24 mIU/mL   CBC     Status: None   Collection Time: 09/22/14  5:50 PM  Result Value Ref Range   WBC 9.2 4.0 - 10.5 K/uL   RBC 4.14 3.87 - 5.11 MIL/uL   Hemoglobin 13.0 12.0 - 15.0 g/dL   HCT 82.937.4 56.236.0 - 13.046.0 %   MCV 90.3 78.0 - 100.0 fL   MCH 31.4 26.0 - 34.0 pg   MCHC 34.8 30.0 - 36.0 g/dL   RDW 86.512.1 78.411.5 - 69.615.5 %   Platelets 244 150 - 400 K/uL  hCG, quantitative, pregnancy     Status: Abnormal   Collection Time: 09/22/14  5:50 PM  Result Value Ref Range   hCG, Beta Chain, Quant, S 85761 (H) <5 mIU/mL    Comment:          GEST. AGE      CONC.  (mIU/mL)   <=1 WEEK        5 - 50     2 WEEKS       50 - 500     3 WEEKS       100 - 10,000     4 WEEKS     1,000 - 30,000     5 WEEKS     3,500 - 115,000   6-8 WEEKS     12,000 - 270,000    12 WEEKS     15,000 - 220,000        FEMALE AND NON-PREGNANT FEMALE:     LESS THAN 5 mIU/mL   ABO/Rh     Status: None   Collection Time: 09/22/14  5:50 PM  Result Value Ref Range   ABO/RH(D) O NEG   Wet prep, genital     Status: Abnormal   Collection Time: 09/22/14  6:10 PM  Result Value Ref Range   Yeast Wet Prep HPF POC NONE SEEN NONE SEEN   Trich, Wet Prep NONE SEEN NONE SEEN   Clue Cells  Wet Prep HPF POC NONE SEEN NONE SEEN  WBC, Wet Prep HPF POC FEW (A) NONE SEEN    Comment: FEW BACTERIA SEEN  US Ob Comp Less 14 Wks  09/22/2014   CLINICAL DATA:  First trimester bleeding  EXAM: OBSTETRIC <14 WK [place] AND TRANSVAGINAL OB US  TECHNIQUE: Both transabdominal and transvaginal ultrasound examinations were performed for complete evaluation of the gestation as well as the maternal uterus, adnexal regions, and pelvic cul-de-sac. Transvaginal technique was performed to assess early pregnancy.  COMPARISON:  None.  FINDINGS: Intrauterine gestational sac: Visualized and normal in shape.  Yolk sac:  Present  Embryo:  Present  Cardiac Activity: Present  Heart Rate: 175  bpm  CRL:  14  mm   7 w   5 d                  [place] EDC: 05/06/2015.  Maternal uterus/adnexae:  Subchorionic hemorrhage: None  Right ovary: Normal  Left ovary: Normal  Other :None  Free fluid:  None  IMPRESSION: 1. Single living intrauterine gestation. The estimated gestational age is 7 weeks and 5 days. 2. No complications identified.   Electronically Signed   By: Signa Kell M.D.   On: 09/22/2014 20:13   US Ob Transvaginal  09/22/2014   CLINICAL DATA:  First trimester bleeding  EXAM: OBSTETRIC <14 WK [place] AND TRANSVAGINAL OB US  TECHNIQUE: Both transabdominal and transvaginal ultrasound examinations were performed for complete evaluation of the gestation as well as the maternal uterus, adnexal regions, and pelvic cul-de-sac. Transvaginal technique was performed to assess early pregnancy.  COMPARISON:  None.  FINDINGS: Intrauterine gestational sac: Visualized and normal in shape.  Yolk sac:  Present  Embryo:  Present  Cardiac Activity: Present  Heart Rate: 175  bpm  CRL:  14  mm   7 w   5 d                  [place] EDC: 05/06/2015.  Maternal uterus/adnexae:  Subchorionic hemorrhage: None  Right ovary: Normal  Left ovary: Normal  Other :None  Free fluid:  None  IMPRESSION: 1. Single living intrauterine gestation. The estimated gestational age is 7  weeks and 5 days. 2. No complications identified.   Electronically Signed   By: Signa Kell M.D.   On: 09/22/2014 20:13    Assessment and Plan  Bleeding in pregnancy SLIUP [redacted]w[redacted]d RH neg- Rhogam in MAU F/u with Dr. Marice Potter, et al for [facility] care [facility] 09/22/2014, 6:50 PM

## 2014-09-22 NOTE — MAU Note (Signed)
Pt started having cramping this morning and noticed some light spotting after she used the bathroom this morning.

## 2014-09-23 LAB — RH IG WORKUP (INCLUDES ABO/RH)
ABO/RH(D): O NEG
Antibody Screen: NEGATIVE
Gestational Age(Wks): 7.5
UNIT DIVISION: 0

## 2014-09-24 LAB — GC/CHLAMYDIA PROBE AMP (~~LOC~~) NOT AT ARMC
Chlamydia: NEGATIVE
Neisseria Gonorrhea: NEGATIVE

## 2014-09-25 LAB — HIV ANTIBODY (ROUTINE TESTING W REFLEX): HIV Screen 4th Generation wRfx: NONREACTIVE

## 2014-09-26 ENCOUNTER — Encounter: Payer: Self-pay | Admitting: Obstetrics & Gynecology

## 2014-09-26 ENCOUNTER — Other Ambulatory Visit: Payer: Self-pay | Admitting: Obstetrics & Gynecology

## 2014-09-26 ENCOUNTER — Ambulatory Visit (INDEPENDENT_AMBULATORY_CARE_PROVIDER_SITE_OTHER): Payer: BLUE CROSS/BLUE SHIELD | Admitting: Obstetrics & Gynecology

## 2014-09-26 VITALS — BP 128/72 | HR 105 | Wt 174.0 lb

## 2014-09-26 DIAGNOSIS — Z36 Encounter for antenatal screening of mother: Secondary | ICD-10-CM

## 2014-09-26 DIAGNOSIS — Z3481 Encounter for supervision of other normal pregnancy, first trimester: Secondary | ICD-10-CM | POA: Diagnosis not present

## 2014-09-26 LAB — OB RESULTS CONSOLE GC/CHLAMYDIA: GC PROBE AMP, GENITAL: NEGATIVE

## 2014-09-26 NOTE — Addendum Note (Signed)
Addended by: Granville LewisLARK, Talvin Christianson L on: 09/26/2014 12:05 PM   Modules accepted: Orders

## 2014-09-26 NOTE — Progress Notes (Signed)
Bedside U/S showed IUP with FHT 185 BPM and CRL 20.1085mm and GA 8 w 5 days

## 2014-09-26 NOTE — Progress Notes (Signed)
   Subjective:    Tara Vargas is a MW [id number] 4756w0d being seen today for her first obstetrical visit.  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 She saw Dr. April Vargas for this pregnancy and took 2 cycles of Femara.. Patient does intend to breast feed. Pregnancy history fully reviewed.  Patient reports no complaints except some occasional spotting. She was seen in the MAU 4 days ago.  Filed Vitals:   09/26/14 0844  BP: 128/72  Pulse: 105  Weight: 174 lb (78.926 kg)    HISTORY: OB History  Gravida Para Term Preterm AB SAB TAB Ectopic Multiple Living  5 1 1  3 3    1     # Outcome Date GA Lbr Len/2nd Weight Sex Delivery Anes PTL Lv  5 Current           4 SAB 2015             Comments: System Generated. Please review and update pregnancy details.  3 SAB           2 SAB           1 Term              Past Medical History  Diagnosis Date  . Habitual aborter, antepartum   . Abnormal Pap smear of cervix     Repeat pap was negative   Past Surgical History  Procedure Laterality Date  . No past surgeries    . Wisdom tooth extraction     Family History  Problem Relation Age of Onset  . Diabetes Mother   . Diabetes Father   . Cancer - Lung Maternal Grandmother   . Cancer Paternal Grandmother     breast     Exam    Uterus:     Pelvic Exam:    Perineum: No Hemorrhoids   Vulva: normal   Vagina:  normal mucosa   pH:    Cervix: anteverted   Adnexa: normal adnexa   Bony Pelvis: anthropoid  System: Breast:  normal appearance, no masses or tenderness   Skin: normal coloration and turgor, no rashes    Neurologic: oriented   Extremities: normal strength, tone, and muscle mass   HEENT PERRLA   Mouth/Teeth mucous membranes moist, pharynx normal without lesions   Neck supple   Cardiovascular: regular rate and rhythm   Respiratory:  appears well, vitals normal, no respiratory distress, acyanotic, normal RR, ear and throat exam is  normal, neck free of mass or lymphadenopathy, chest clear, no wheezing, crepitations, rhonchi, normal symmetric air entry   Abdomen: soft, non-tender; bowel sounds normal; no masses,  no organomegaly   Urinary: urethral meatus normal      Assessment:    Pregnancy: [id number] Patient Active Problem List   Diagnosis Date Noted  . Encounter for supervision of other normal pregnancy in first trimester 09/26/2014        Plan:     Initial labs drawn. Prenatal vitamins. Problem list reviewed and updated. Genetic Screening discussed Quad Screen: requested.  Ultrasound discussed; fetal survey: requested.  Follow up in 4 weeks. If she changes her mind after a discussion with her husband, then she will come back in 3 weeks for the First Screen.   Tara Vargas C. 09/26/2014

## 2014-09-26 NOTE — Progress Notes (Signed)
Pt here for NOB after doing 2 rounds of Femara with Dr April MansonYalcinkaya

## 2014-09-27 LAB — URINE CULTURE
COLONY COUNT: NO GROWTH
Organism ID, Bacteria: NO GROWTH

## 2014-09-27 LAB — HIV ANTIBODY (ROUTINE TESTING W REFLEX): HIV: NONREACTIVE

## 2014-09-28 ENCOUNTER — Encounter: Payer: BLUE CROSS/BLUE SHIELD | Admitting: Family

## 2014-09-29 LAB — GC/CHLAMYDIA PROBE AMP
CT PROBE, AMP APTIMA: NEGATIVE
GC Probe RNA: NEGATIVE

## 2014-10-01 LAB — OBSTETRIC PANEL
Antibody Screen: POSITIVE — AB
BASOS ABS: 0 10*3/uL (ref 0.0–0.1)
BASOS PCT: 0 % (ref 0–1)
Eosinophils Absolute: 0.1 10*3/uL (ref 0.0–0.7)
Eosinophils Relative: 2 % (ref 0–5)
HCT: 38.5 % (ref 36.0–46.0)
Hemoglobin: 13.1 g/dL (ref 12.0–15.0)
Hepatitis B Surface Ag: NEGATIVE
Lymphocytes Relative: 24 % (ref 12–46)
Lymphs Abs: 1.6 10*3/uL (ref 0.7–4.0)
MCH: 31 pg (ref 26.0–34.0)
MCHC: 34 g/dL (ref 30.0–36.0)
MCV: 91 fL (ref 78.0–100.0)
MPV: 10.2 fL (ref 8.6–12.4)
Monocytes Absolute: 0.5 10*3/uL (ref 0.1–1.0)
Monocytes Relative: 7 % (ref 3–12)
NEUTROS ABS: 4.4 10*3/uL (ref 1.7–7.7)
Neutrophils Relative %: 67 % (ref 43–77)
PLATELETS: 260 10*3/uL (ref 150–400)
RBC: 4.23 MIL/uL (ref 3.87–5.11)
RDW: 13 % (ref 11.5–15.5)
Rh Type: NEGATIVE
Rubella: 2.67 Index — ABNORMAL HIGH (ref <0.90)
WBC: 6.6 10*3/uL (ref 4.0–10.5)

## 2014-10-01 LAB — PRENATAL ANTIBODY IDENTIFICATION

## 2014-10-01 LAB — ANTIBODY TITER (PRENATAL TITER): Ab Titer: <2

## 2014-10-02 ENCOUNTER — Encounter: Payer: Self-pay | Admitting: *Deleted

## 2014-10-17 ENCOUNTER — Other Ambulatory Visit (INDEPENDENT_AMBULATORY_CARE_PROVIDER_SITE_OTHER): Payer: BLUE CROSS/BLUE SHIELD | Admitting: *Deleted

## 2014-10-17 VITALS — BP 123/80 | HR 80 | Resp 16 | Ht 66.0 in | Wt 176.0 lb

## 2014-10-17 DIAGNOSIS — Z3481 Encounter for supervision of other normal pregnancy, first trimester: Secondary | ICD-10-CM | POA: Diagnosis not present

## 2014-10-17 NOTE — Progress Notes (Signed)
HPI Pt here for  Tara Vargas is a 29 y.o. female here for a FHR check.  Current complaints: Anxiety that pregnancy is not going well. She has a Hx of 3 miscarriages.    Plan:    Ultrasound    Bedside US shows Single IUP with FHR 168 and HC measures 2250w1d - pictures given to patient.

## 2014-10-24 ENCOUNTER — Ambulatory Visit (INDEPENDENT_AMBULATORY_CARE_PROVIDER_SITE_OTHER): Payer: BLUE CROSS/BLUE SHIELD | Admitting: Obstetrics & Gynecology

## 2014-10-24 ENCOUNTER — Encounter: Payer: Self-pay | Admitting: Obstetrics & Gynecology

## 2014-10-24 VITALS — BP 120/84 | HR 73 | Wt 176.0 lb

## 2014-10-24 DIAGNOSIS — Z3481 Encounter for supervision of other normal pregnancy, first trimester: Secondary | ICD-10-CM

## 2014-10-24 NOTE — Progress Notes (Signed)
Routine visit. No problems. Quad screen at next visit.

## 2014-11-15 ENCOUNTER — Ambulatory Visit (INDEPENDENT_AMBULATORY_CARE_PROVIDER_SITE_OTHER): Payer: BLUE CROSS/BLUE SHIELD | Admitting: Obstetrics & Gynecology

## 2014-11-15 VITALS — BP 116/73 | HR 90 | Wt 181.0 lb

## 2014-11-15 DIAGNOSIS — Z36 Encounter for antenatal screening of mother: Secondary | ICD-10-CM | POA: Diagnosis not present

## 2014-11-15 DIAGNOSIS — Z3492 Encounter for supervision of normal pregnancy, unspecified, second trimester: Secondary | ICD-10-CM

## 2014-11-15 NOTE — Progress Notes (Signed)
Positive Rh antibodies  Quad screen drawn

## 2014-11-15 NOTE — Progress Notes (Signed)
Routine visit. Good FM. MSAFP today. Schedule anatomy u/s

## 2014-11-16 LAB — AFP, QUAD SCREEN
AFP: 26.5 ng/mL
Curr Gest Age: 16.1 wks.days
HCG TOTAL: 41.33 [IU]/mL
INH: 158.6 pg/mL
Interpretation-AFP: NEGATIVE
MoM for AFP: 0.9
MoM for INH: 0.99
MoM for hCG: 1.16
Open Spina bifida: NEGATIVE
Osb Risk: 1:18700 {titer}
Tri 18 Scr Risk Est: NEGATIVE
Trisomy 18 (Edward) Syndrome Interp.: 1:56800 {titer}
UE3 MOM: 1.12
UE3 VALUE: 0.92 ng/mL

## 2014-11-19 ENCOUNTER — Telehealth: Payer: Self-pay

## 2014-11-19 NOTE — Telephone Encounter (Signed)
Patient called and idd by name and dob. Patient made aware that her AFP came back WNL. Armandina StammerJennifer Aster Screws RN BSN 11-19-14

## 2014-11-21 ENCOUNTER — Ambulatory Visit (INDEPENDENT_AMBULATORY_CARE_PROVIDER_SITE_OTHER): Payer: BLUE CROSS/BLUE SHIELD | Admitting: Physician Assistant

## 2014-11-21 ENCOUNTER — Encounter: Payer: BLUE CROSS/BLUE SHIELD | Admitting: Obstetrics & Gynecology

## 2014-11-21 VITALS — BP 106/70 | HR 79 | Temp 98.5°F | Resp 17 | Ht 66.0 in | Wt 180.2 lb

## 2014-11-21 DIAGNOSIS — R509 Fever, unspecified: Secondary | ICD-10-CM

## 2014-11-21 DIAGNOSIS — J069 Acute upper respiratory infection, unspecified: Secondary | ICD-10-CM

## 2014-11-21 DIAGNOSIS — Z3492 Encounter for supervision of normal pregnancy, unspecified, second trimester: Secondary | ICD-10-CM

## 2014-11-21 LAB — POCT INFLUENZA A/B
INFLUENZA A, POC: NEGATIVE
Influenza B, POC: NEGATIVE

## 2014-11-21 LAB — POCT CBC
Granulocyte percent: 77.5 %G (ref 37–80)
HEMATOCRIT: 36.5 % — AB (ref 37.7–47.9)
Hemoglobin: 12.1 g/dL — AB (ref 12.2–16.2)
Lymph, poc: 1.7 (ref 0.6–3.4)
MCH: 30.4 pg (ref 27–31.2)
MCHC: 33.3 g/dL (ref 31.8–35.4)
MCV: 91.3 fL (ref 80–97)
MID (cbc): 0.6 (ref 0–0.9)
MPV: 7.2 fL (ref 0–99.8)
POC Granulocyte: 8.2 — AB (ref 2–6.9)
POC LYMPH PERCENT: 16.5 %L (ref 10–50)
POC MID %: 6 % (ref 0–12)
Platelet Count, POC: 272 10*3/uL (ref 142–424)
RBC: 4 M/uL — AB (ref 4.04–5.48)
RDW, POC: 13.3 %
WBC: 10.6 10*3/uL — AB (ref 4.6–10.2)

## 2014-11-21 MED ORDER — AZITHROMYCIN 250 MG PO TABS: ORAL_TABLET | ORAL | Status: DC

## 2014-11-21 NOTE — Patient Instructions (Signed)
Take zithromax until finished. Drink plenty of water. Tylenol as needed. If not getting better in 7-10 days, return for further eval.

## 2014-11-21 NOTE — Progress Notes (Signed)
[facility] [address] [place] [address] [phone]  Date:  11/21/2014   Name:  Tara Vargas   DOB:  1985-08-08   MRN:  [id number]  PCP:  Pcp Not In System    Chief Complaint: Nasal Congestion   History of Present Illness:  This is a 29 y.o.  [redacted] week pregnant female who is presenting with nasal congestion, cough, myalgias and fever x 4 days. Has had intermittent fevers. Fever 101.2 this morning. Took 1 gm tylenol and now temp 98.5. Cough is productive at times. Nasal discharge same color as what she is coughing up - brown/green. Several people with flu in the last week at work. No history of env allergies or asthma. Not a smoker. Did not get flu shot in the fall. Denies SOB, wheezing, abdominal pain, diarrhea. She has had some nausea and vomiting but states this is normal with her pregnancy so far. Pt is worried about this sickness with her pregnancy. Wants to be sure she does not need antibiotics. No recent tick bite. No urinary symptoms.  Review of Systems:   Review of Systems See HPI  Patient Active Problem List   Diagnosis Date Noted  . Encounter for supervision of other normal pregnancy in first trimester 09/26/2014    Prior to Admission medications   Medication Sig Start Date End Date Taking? Authorizing Provider  acetaminophen (TYLENOL) 325 MG tablet Take 650 mg by mouth every 6 (six) hours as needed for mild pain or headache.    Yes Historical Provider, MD  Prenatal Vit-Fe Fumarate-FA (PRENATAL MULTIVITAMIN) TABS tablet Take 1 tablet by mouth daily.   Yes Historical Provider, MD  Probiotic Product (PROBIOTIC DAILY PO) Take 2 capsules by mouth daily.    Yes Historical Provider, MD    Allergies  Allergen Reactions  . Amoxicillin Hives  . Morphine And Related Hives    Past Surgical History  Procedure Laterality Date  . No past surgeries    . Wisdom tooth extraction      History  Substance Use Topics  . Smoking status: Never Smoker    . Smokeless tobacco: Never Used  . Alcohol Use: No    Family History  Problem Relation Age of Onset  . Diabetes Mother   . Diabetes Father   . Cancer - Lung Maternal Grandmother   . Cancer Paternal Grandmother     breast    Medication list has been reviewed and updated.  Physical Examination:  Physical Exam  Constitutional: She is oriented to person, place, and time. She appears well-developed and well-nourished. No distress.  HENT:  Head: Normocephalic and atraumatic.  Right Ear: Hearing, tympanic membrane, external ear and ear canal normal.  Left Ear: Hearing, tympanic membrane, external ear and ear canal normal.  Nose: Nose normal. Right sinus exhibits no maxillary sinus tenderness and no frontal sinus tenderness. Left sinus exhibits no maxillary sinus tenderness and no frontal sinus tenderness.  Mouth/Throat: Uvula is midline and mucous membranes are normal. Posterior oropharyngeal edema (3+ bilaterally (pt states tonsils always large)) present. No oropharyngeal exudate, posterior oropharyngeal erythema or tonsillar abscesses.  Eyes: Conjunctivae and lids are normal. Right eye exhibits no discharge. Left eye exhibits no discharge. No scleral icterus.  Cardiovascular: Normal rate, regular rhythm, normal heart sounds and normal pulses.   No murmur heard. Pulmonary/Chest: Effort normal and breath sounds normal. No respiratory distress. She has no wheezes. She has no rhonchi. She has no rales.  Musculoskeletal: Normal range  of motion.  Lymphadenopathy:       Head (right side): No submental, no submandibular and no tonsillar adenopathy present.       Head (left side): No submental, no submandibular and no tonsillar adenopathy present.    She has no cervical adenopathy.  Neurological: She is alert and oriented to person, place, and time.  Skin: Skin is warm, dry and intact. No lesion and no rash noted.  Psychiatric: She has a normal mood and affect. Her speech is normal and  behavior is normal. Thought content normal.   BP 106/70 mmHg  Pulse 79  Temp(Src) 98.5 F (36.9 C) (Oral)  Resp 17  Ht 5\' 6"  (1.676 m)  Wt 180 lb 3.2 oz (81.738 kg)  BMI 29.10 kg/m2  SpO2 99%  LMP 07/25/2014  Results for orders placed or performed in visit on 11/21/14  POCT CBC  Result Value Ref Range   WBC 10.6 (A) 4.6 - 10.2 K/uL   Lymph, poc 1.7 0.6 - 3.4   POC LYMPH PERCENT 16.5 10 - 50 %L   MID (cbc) 0.6 0 - 0.9   POC MID % 6.0 0 - 12 %M   POC Granulocyte 8.2 (A) 2 - 6.9   Granulocyte percent 77.5 37 - 80 %G   RBC 4.00 (A) 4.04 - 5.48 M/uL   Hemoglobin 12.1 (A) 12.2 - 16.2 g/dL   HCT, POC 40.136.5 (A) 02.737.7 - 47.9 %   MCV 91.3 80 - 97 fL   MCH, POC 30.4 27 - 31.2 pg   MCHC 33.3 31.8 - 35.4 g/dL   RDW, POC 25.313.3 %   Platelet Count, POC 272 142 - 424 K/uL   MPV 7.2 0 - 99.8 fL  POCT Influenza A/B  Result Value Ref Range   Influenza A, POC Negative    Influenza B, POC Negative     Assessment and Plan:  1. Fever, unspecified fever cause 2. URI 3. Single pregnancy, second trimester Mildly elevated wbc with left shift. Influenza negative. Will treat with abx for sinus/lung pathogens with zithromax (pregnancy category B). Return if not getting better in 5-7 days. - POCT CBC - POCT Influenza A/B - azithromycin (ZITHROMAX) 250 MG tablet; Take 2 tabs PO x 1 dose, then 1 tab PO QD x 4 days  Dispense: 6 tablet; Refill: 0 - azithromycin (ZITHROMAX) 250 MG tablet; Take 2 tabs PO x 1 dose, then 1 tab PO QD x 4 days  Dispense: 6 tablet; Refill: 0   Roswell MinersNicole V. Dyke BrackettBush, PA-C, MHS [facility]  11/21/2014

## 2014-12-07 ENCOUNTER — Ambulatory Visit (HOSPITAL_COMMUNITY)
Admission: RE | Admit: 2014-12-07 | Discharge: 2014-12-07 | Disposition: A | Discharge status: Home / Self Care | Payer: BLUE CROSS/BLUE SHIELD | Source: Ambulatory Visit | Attending: Obstetrics & Gynecology | Admitting: Obstetrics & Gynecology

## 2014-12-07 ENCOUNTER — Ambulatory Visit (HOSPITAL_COMMUNITY): Admission: RE | Admit: 2014-12-07 | Payer: BLUE CROSS/BLUE SHIELD | Source: Ambulatory Visit

## 2014-12-07 ENCOUNTER — Other Ambulatory Visit: Payer: Self-pay | Admitting: Obstetrics & Gynecology

## 2014-12-07 DIAGNOSIS — Z3A19 19 weeks gestation of pregnancy: Secondary | ICD-10-CM | POA: Diagnosis not present

## 2014-12-07 DIAGNOSIS — Z3492 Encounter for supervision of normal pregnancy, unspecified, second trimester: Secondary | ICD-10-CM

## 2014-12-07 DIAGNOSIS — Z3689 Encounter for other specified antenatal screening: Secondary | ICD-10-CM | POA: Insufficient documentation

## 2014-12-07 DIAGNOSIS — Z36 Encounter for antenatal screening of mother: Secondary | ICD-10-CM | POA: Diagnosis not present

## 2014-12-11 ENCOUNTER — Ambulatory Visit (INDEPENDENT_AMBULATORY_CARE_PROVIDER_SITE_OTHER): Payer: BLUE CROSS/BLUE SHIELD | Admitting: Obstetrics & Gynecology

## 2014-12-11 ENCOUNTER — Encounter: Payer: Self-pay | Admitting: Obstetrics & Gynecology

## 2014-12-11 VITALS — BP 112/74 | HR 87 | Wt 181.0 lb

## 2014-12-11 DIAGNOSIS — Z36 Encounter for antenatal screening of mother: Secondary | ICD-10-CM

## 2014-12-11 DIAGNOSIS — Z3481 Encounter for supervision of other normal pregnancy, first trimester: Secondary | ICD-10-CM

## 2014-12-11 DIAGNOSIS — Z0489 Encounter for examination and observation for other specified reasons: Secondary | ICD-10-CM

## 2014-12-11 DIAGNOSIS — IMO0002 Reserved for concepts with insufficient information to code with codable children: Secondary | ICD-10-CM

## 2014-12-11 NOTE — Progress Notes (Signed)
Interested in waterbirth, information given Limited anatomy scan at 19 weeks, rescan ordered No other complaints or concerns.  Routine obstetric precautions reviewed.

## 2014-12-11 NOTE — Patient Instructions (Signed)
Return to clinic for any obstetric concerns or go to MAU for evaluation  

## 2014-12-24 ENCOUNTER — Encounter (HOSPITAL_COMMUNITY): Payer: Self-pay | Admitting: *Deleted

## 2014-12-24 ENCOUNTER — Telehealth: Payer: Self-pay

## 2014-12-24 ENCOUNTER — Inpatient Hospital Stay (HOSPITAL_COMMUNITY)
Admission: EM | Admit: 2014-12-24 | Discharge: 2014-12-24 | Disposition: A | Discharge status: Home / Self Care | Payer: BLUE CROSS/BLUE SHIELD | Source: Ambulatory Visit | Attending: Obstetrics & Gynecology | Admitting: Obstetrics & Gynecology

## 2014-12-24 DIAGNOSIS — Z3A21 21 weeks gestation of pregnancy: Secondary | ICD-10-CM | POA: Insufficient documentation

## 2014-12-24 DIAGNOSIS — O2342 Unspecified infection of urinary tract in pregnancy, second trimester: Secondary | ICD-10-CM | POA: Diagnosis not present

## 2014-12-24 DIAGNOSIS — N390 Urinary tract infection, site not specified: Secondary | ICD-10-CM

## 2014-12-24 LAB — WET PREP, GENITAL
Clue Cells Wet Prep HPF POC: NONE SEEN
Trich, Wet Prep: NONE SEEN
Yeast Wet Prep HPF POC: NONE SEEN

## 2014-12-24 LAB — URINALYSIS, ROUTINE W REFLEX MICROSCOPIC
BILIRUBIN URINE: NEGATIVE
Glucose, UA: NEGATIVE mg/dL
Ketones, ur: NEGATIVE mg/dL
Nitrite: NEGATIVE
PROTEIN: NEGATIVE mg/dL
Urobilinogen, UA: 0.2 mg/dL (ref 0.0–1.0)
pH: 6 (ref 5.0–8.0)

## 2014-12-24 LAB — URINE MICROSCOPIC-ADD ON

## 2014-12-24 MED ORDER — NITROFURANTOIN MONOHYD MACRO 100 MG PO CAPS: 100.0000 mg | ORAL_CAPSULE | Freq: Two times a day (BID) | ORAL | Status: AC

## 2014-12-24 NOTE — MAU Note (Signed)
Pelvic pressure since yesterday, also burning sensation.  Having braxton hicks uc's, denies bleeding or LOF.

## 2014-12-24 NOTE — MAU Provider Note (Signed)
History     CSN: [id number]  Arrival date and time: 12/24/14 1057   None     Chief Complaint  Patient presents with  . Pelvic Pain   HPI  Tara Vargas 29 y.o. [id number]  presents to the MAU stating that she is having occasional contractions and pelvic pressure and burning sensation in her vagina. Denies LOF, Vaginal discharge . Reports positive fetal movement. Pt just moved over the weekend and is very emotional  Past Medical History  Diagnosis Date  . Habitual aborter, antepartum   . Abnormal Pap smear of cervix     Repeat pap was negative    Past Surgical History  Procedure Laterality Date  . Wisdom tooth extraction      Family History  Problem Relation Age of Onset  . Diabetes Mother   . Diabetes Father   . Cancer - Lung Maternal Grandmother   . Cancer Paternal Grandmother     breast    History  Substance Use Topics  . Smoking status: Never Smoker   . Smokeless tobacco: Never Used  . Alcohol Use: No    Allergies:  Allergies  Allergen Reactions  . Amoxicillin Hives  . Morphine And Related Hives    Prescriptions prior to admission  Medication Sig Dispense Refill Last Dose  . Prenatal Vit-Fe Fumarate-FA (PRENATAL MULTIVITAMIN) TABS tablet Take 1 tablet by mouth daily.   12/23/2014 at Unknown time  . Probiotic Product (PROBIOTIC DAILY PO) Take 2 capsules by mouth daily.    12/23/2014 at Unknown time    Review of Systems  Constitutional: Negative for fever.  Genitourinary: Positive for dysuria.       Vaginal burning and pressure  All other systems reviewed and are negative.  Physical Exam   Blood pressure 121/58, pulse 65, temperature 98 F (36.7 C), temperature source Oral, resp. rate 18, last menstrual period 07/25/2014.  Physical Exam  Nursing note and vitals reviewed. Constitutional: She is oriented to person, place, and time. She appears well-developed and well-nourished. No distress.  HENT:  Head: Normocephalic and atraumatic.   Neck: Normal range of motion.  Cardiovascular: Normal rate.   Respiratory: Effort normal. No respiratory distress.  GI: Soft. There is no tenderness.  Genitourinary: Vagina normal.  Musculoskeletal: Normal range of motion.  Neurological: She is alert and oriented to person, place, and time.  Skin: Skin is warm and dry.  Psychiatric: She has a normal mood and affect. Her behavior is normal. Judgment and thought content normal.    MAU Course  Procedures Results for orders placed or performed during the hospital encounter of 12/24/14 (from the past 24 hour(s))  Urinalysis, Routine w reflex microscopic (not at [facility])     Status: Abnormal   Collection Time: 12/24/14 11:20 AM  Result Value Ref Range   Color, Urine YELLOW YELLOW   APPearance CLEAR CLEAR   Specific Gravity, Urine <1.005 (L) 1.005 - 1.030   pH 6.0 5.0 - 8.0   Glucose, UA NEGATIVE NEGATIVE mg/dL   Hgb urine dipstick TRACE (A) NEGATIVE   Bilirubin Urine NEGATIVE NEGATIVE   Ketones, ur NEGATIVE NEGATIVE mg/dL   Protein, ur NEGATIVE NEGATIVE mg/dL   Urobilinogen, UA 0.2 0.0 - 1.0 mg/dL   Nitrite NEGATIVE NEGATIVE   Leukocytes, UA MODERATE (A) NEGATIVE  Urine microscopic-add on     Status: Abnormal   Collection Time: 12/24/14 11:20 AM  Result Value Ref Range   Squamous Epithelial / LPF FEW (A) RARE   WBC, UA 3-6 <  3 WBC/hpf   Bacteria, UA FEW (A) RARE  Wet prep, genital     Status: Abnormal   Collection Time: 12/24/14 12:15 PM  Result Value Ref Range   Yeast Wet Prep HPF POC NONE SEEN NONE SEEN   Trich, Wet Prep NONE SEEN NONE SEEN   Clue Cells Wet Prep HPF POC NONE SEEN NONE SEEN   WBC, Wet Prep HPF POC FEW (A) NONE SEEN   MDM Pending blind wet prep and ua; Positive fht's  Assessment and Plan  UTI Macrobid 100mg  Discharge to home  [facility],Tara Vargas 12/24/2014, 12:18 PM

## 2014-12-24 NOTE — Telephone Encounter (Signed)
Patient called stating that she is having a lot of pain and pressure. She denies any vaginal bleeding and baby is moving well. Patient advised to go to MAU for evaluation due to not having any appointments today. Patient states she is not going there and would like appointment. Patient given first available appointment on Wednesday 12-26-14 at 10 am and again patient advised that it would be our first reccommendation for her to be seen today at MAU for her pain and pressure- patient states understanding. Tara StammerJennifer Howard RN BSN

## 2014-12-24 NOTE — Discharge Instructions (Signed)

## 2014-12-26 ENCOUNTER — Encounter: Payer: BLUE CROSS/BLUE SHIELD | Admitting: Obstetrics & Gynecology

## 2015-01-08 ENCOUNTER — Ambulatory Visit (HOSPITAL_COMMUNITY): Payer: BLUE CROSS/BLUE SHIELD

## 2015-01-11 ENCOUNTER — Encounter: Payer: BLUE CROSS/BLUE SHIELD | Admitting: Advanced Practice Midwife

## 2015-01-14 ENCOUNTER — Ambulatory Visit (INDEPENDENT_AMBULATORY_CARE_PROVIDER_SITE_OTHER): Payer: BLUE CROSS/BLUE SHIELD | Admitting: Advanced Practice Midwife

## 2015-01-14 VITALS — BP 132/68 | HR 112 | Wt 197.0 lb

## 2015-01-14 DIAGNOSIS — Z3482 Encounter for supervision of other normal pregnancy, second trimester: Secondary | ICD-10-CM

## 2015-01-14 DIAGNOSIS — Z3481 Encounter for supervision of other normal pregnancy, first trimester: Secondary | ICD-10-CM

## 2015-01-14 NOTE — Patient Instructions (Signed)
Labor and delivery numbers: [phone]  Thinking About Doren Custard???  You must attend a Doren Custard class at [facility]  3rd Wednesday of every month from 7-9pm  Free  [employer] by calling [phone] or online at [email or web address]  Bring [place] the certificate from the class  Waterbirth supplies needed for [employer] patients:  Our practice has a Heritage manager in a Box tub at the hospital that you can borrow  You will need to purchase an accessory kit that has all needed supplies through [employer] (  ) or online  Or you can purchase the supplies separately: o Single-use disposable tub liner for [employer] in a Box (REGULAR size) o New garden hose labeled "lead-free", "suitable for drinking water", "non-toxic" OR "water potable" o Garden hose to remove the dirty water o Electric drain pump to remove water (We recommend 792 gallon per hour or greater pump.)  o Fish net o Bathing suit top (optional) o Long-handled mirror (optional)  [employer] sells tubs for ~ $120 if you would rather purchase your own tub  [employer] (www.thelaborladies.com) $275 for tub rental/set-up & take down/kit   Things that would prevent you from having a waterbirth:  Premature, <37wks  Previous cesarean birth  Presence of thick meconium-stained fluid  Multiple gestation (Twins, triplets, etc.)  Uncontrolled diabetes  Hypertension  Heavy vaginal bleeding  Non-reassuring fetal heart rate  Active infection (MRSA, etc.)  If your labor has to be induced  Other risk issues identified by your obstetrical provider

## 2015-01-14 NOTE — Progress Notes (Signed)
Subjective:  Tara Vargas is a 29 y.o. [id number] at [redacted]w[redacted]d being seen today for ongoing prenatal care.  Patient reports no complaints.  Contractions: Irregular.  Vag. Bleeding: None. Movement: Present. Denies leaking of fluid.   The following portions of the patient's history were reviewed and updated as appropriate: allergies, current medications, past family history, past medical history, past social history, past surgical history and problem list.   Objective:   Filed Vitals:   01/14/15 0831  BP: 132/68  Pulse: 112  Weight: 197 lb (89.359 kg)    Fetal Status: Fetal Heart Rate (bpm): 150   Movement: Present     General:  Alert, oriented and cooperative. Patient is in no acute distress.  Skin: Skin is warm and dry. No rash noted.   Cardiovascular: Normal heart rate noted  Respiratory: Normal respiratory effort, no problems with respiration noted  Abdomen: Soft, gravid, appropriate for gestational age. Pain/Pressure: Absent     Vaginal: Vag. Bleeding: None.    Vag D/C Character: Thin  Cervix: Not evaluated        Extremities: Normal range of motion.  Edema: Trace  Mental Status: Normal mood and affect. Normal behavior. Normal judgment and thought content.   Urinalysis: Urine Protein: Negative Urine Glucose: Negative  Assessment and Plan:  Pregnancy: [id number] at [redacted]w[redacted]d  There are no diagnoses linked to this encounter. Preterm labor symptoms and general obstetric precautions including but not limited to vaginal bleeding, contractions, leaking of fluid and fetal movement were reviewed in detail with the patient.  Questions answered about waterbirth, printed materials provided.  Anticipatory guidance given about 28 week labs at next visit.   Please refer to After Visit Summary for other counseling recommendations.  Return in about 4 weeks (around 02/11/2015).   Hurshel Party, CNM

## 2015-01-16 ENCOUNTER — Ambulatory Visit (HOSPITAL_COMMUNITY)
Admission: RE | Admit: 2015-01-16 | Discharge: 2015-01-16 | Disposition: A | Discharge status: Home / Self Care | Payer: BLUE CROSS/BLUE SHIELD | Source: Ambulatory Visit | Attending: Obstetrics & Gynecology | Admitting: Obstetrics & Gynecology

## 2015-01-16 DIAGNOSIS — Z36 Encounter for antenatal screening of mother: Secondary | ICD-10-CM | POA: Diagnosis present

## 2015-01-16 DIAGNOSIS — Z0489 Encounter for examination and observation for other specified reasons: Secondary | ICD-10-CM

## 2015-01-16 DIAGNOSIS — IMO0002 Reserved for concepts with insufficient information to code with codable children: Secondary | ICD-10-CM

## 2015-01-16 DIAGNOSIS — Z3481 Encounter for supervision of other normal pregnancy, first trimester: Secondary | ICD-10-CM

## 2015-01-16 LAB — TYPE AND SCREEN
ABO/RH(D): O NEG
Antibody Screen: NEGATIVE

## 2015-01-17 ENCOUNTER — Telehealth (HOSPITAL_COMMUNITY): Payer: Self-pay | Admitting: *Deleted

## 2015-01-17 NOTE — Telephone Encounter (Signed)
Telephone call to pt, name and DOB verified.  Negative antibody screen results given to pt.  Pt voices understanding.

## 2015-01-21 ENCOUNTER — Ambulatory Visit (INDEPENDENT_AMBULATORY_CARE_PROVIDER_SITE_OTHER): Payer: BLUE CROSS/BLUE SHIELD | Admitting: Advanced Practice Midwife

## 2015-01-21 VITALS — BP 128/78 | HR 101 | Wt 200.0 lb

## 2015-01-21 DIAGNOSIS — Z6791 Unspecified blood type, Rh negative: Secondary | ICD-10-CM | POA: Insufficient documentation

## 2015-01-21 DIAGNOSIS — Z3481 Encounter for supervision of other normal pregnancy, first trimester: Secondary | ICD-10-CM

## 2015-01-21 DIAGNOSIS — Z3482 Encounter for supervision of other normal pregnancy, second trimester: Secondary | ICD-10-CM

## 2015-01-21 DIAGNOSIS — O360121 Maternal care for anti-D [Rh] antibodies, second trimester, fetus 1: Secondary | ICD-10-CM

## 2015-01-21 DIAGNOSIS — O26852 Spotting complicating pregnancy, second trimester: Secondary | ICD-10-CM | POA: Diagnosis not present

## 2015-01-21 DIAGNOSIS — O26899 Other specified pregnancy related conditions, unspecified trimester: Secondary | ICD-10-CM

## 2015-01-21 DIAGNOSIS — O4692 Antepartum hemorrhage, unspecified, second trimester: Secondary | ICD-10-CM

## 2015-01-21 MED ORDER — RHO D IMMUNE GLOBULIN 1500 UNIT/2ML IJ SOSY
300.0000 ug | PREFILLED_SYRINGE | Freq: Once | INTRAMUSCULAR | Status: AC
  Administered 2015-01-21: 300 ug via INTRAMUSCULAR

## 2015-01-21 NOTE — Patient Instructions (Signed)
Vaginal Bleeding During Pregnancy, Second Trimester °A small amount of bleeding (spotting) from the vagina is relatively common in pregnancy. It usually stops on its own. Various things can cause bleeding or spotting in pregnancy. Some bleeding may be related to the pregnancy, and some may not. Sometimes the bleeding is normal and is not a problem. However, bleeding can also be a sign of something serious. Be sure to tell your health care provider about any vaginal bleeding right away. °Some possible causes of vaginal bleeding during the second trimester include: °· Infection, inflammation, or growths on the cervix.   °· The placenta may be partially or completely covering the opening of the cervix inside the uterus (placenta previa). °· The placenta may have separated from the uterus (abruption of the placenta).   °· You may be having early (preterm) labor.   °· The cervix may not be strong enough to keep a baby inside the uterus (cervical insufficiency).   °· Tiny cysts may have developed in the uterus instead of pregnancy tissue (molar pregnancy).  °HOME CARE INSTRUCTIONS  °Watch your condition for any changes. The following actions may help to lessen any discomfort you are feeling: °· Follow your health care provider's instructions for limiting your activity. If your health care provider orders bed rest, you may need to stay in bed and only get up to use the bathroom. However, your health care provider may allow you to continue light activity. °· If needed, make plans for someone to help with your regular activities and responsibilities while you are on bed rest. °· Keep track of the number of pads you use each day, how often you change pads, and how soaked (saturated) they are. Write this down. °· Do not use tampons. Do not douche. °· Do not have sexual intercourse or orgasms until approved by your health care provider. °· If you pass any tissue from your vagina, save the tissue so you can show it to your  health care provider. °· Only take over-the-counter or prescription medicines as directed by your health care provider. °· Do not take aspirin because it can make you bleed. °· Do not exercise or perform any strenuous activities or heavy lifting without your health care provider's permission. °· Keep all follow-up appointments as directed by your health care provider. °SEEK MEDICAL CARE IF: °· You have any vaginal bleeding during any part of your pregnancy. °· You have cramps or labor pains. °· You have a fever, not controlled by medicine. °SEEK IMMEDIATE MEDICAL CARE IF:  °· You have severe cramps in your back or belly (abdomen). °· You have contractions. °· You have chills. °· You pass large clots or tissue from your vagina. °· Your bleeding increases. °· You feel light-headed or weak, or you have fainting episodes. °· You are leaking fluid or have a gush of fluid from your vagina. °MAKE SURE YOU: °· Understand these instructions. °· Will watch your condition. °· Will get help right away if you are not doing well or get worse. °Document Released: 03/11/2005 Document Revised: 06/06/2013 Document Reviewed: 02/06/2013 °ExitCare® Patient Information ©2015 [employer]. This information is not intended to replace advice given to you by your health care provider. Make sure you discuss any questions you have with your health care provider. ° °Pelvic Rest °Pelvic rest is sometimes recommended for women when:  °· The placenta is partially or completely covering the opening of the cervix (placenta previa). °· There is bleeding between the uterine wall and the amniotic sac in the   first trimester (subchorionic hemorrhage). °· The cervix begins to open without labor starting (incompetent cervix, cervical insufficiency). °· The labor is too early (preterm labor). °HOME CARE INSTRUCTIONS °· Do not have sexual intercourse, stimulation, or an orgasm. °· Do not use tampons, douche, or put anything in the vagina. °· Do not lift  anything over 10 pounds (4.5 kg). °· Avoid strenuous activity or straining your pelvic muscles. °SEEK MEDICAL CARE IF:  °· You have any vaginal bleeding during pregnancy. Treat this as a potential emergency. °· You have cramping pain felt low in the stomach (stronger than menstrual cramps). °· You notice vaginal discharge (watery, mucus, or bloody). °· You have a low, dull backache. °· There are regular contractions or uterine tightening. °SEEK IMMEDIATE MEDICAL CARE IF: °You have vaginal bleeding and have placenta previa.  °Document Released: 09/26/2010 Document Revised: 08/24/2011 Document Reviewed: 09/26/2010 °ExitCare® Patient Information ©2015 ExitCare[employer] LLC. This information is not intended to replace advice given to you by your health care provider. Make sure you discuss any questions you have with your health care provider. ° °

## 2015-01-21 NOTE — Progress Notes (Signed)
Pt states she started spotting bright pink this morning.

## 2015-01-21 NOTE — Progress Notes (Signed)
Work-in visit for spotting this morning. Pink mucus when wiping. Has been having ~3 painless BH per day. No increase before this episode of spotting. No recent IC or abd pain. No previa per anatomy scan. O neg. Rhophylac today. Scant, faintly tan discharge on spec exam. No pooling. Cervix slightly friable appearing, but incompletely visualized. Cervix long and closed. Spotting likely from bloody show or friable cervix.  Will send wet prep. Pelvic rest x 1 week. Bleeding precautions.

## 2015-01-22 ENCOUNTER — Telehealth: Payer: Self-pay | Admitting: *Deleted

## 2015-01-22 DIAGNOSIS — N760 Acute vaginitis: Principal | ICD-10-CM

## 2015-01-22 DIAGNOSIS — B9689 Other specified bacterial agents as the cause of diseases classified elsewhere: Secondary | ICD-10-CM

## 2015-01-22 LAB — WET PREP BY MOLECULAR PROBE
CANDIDA SPECIES: NEGATIVE
GARDNERELLA VAGINALIS: POSITIVE — AB
Trichomonas vaginosis: NEGATIVE

## 2015-01-22 MED ORDER — METRONIDAZOLE 500 MG PO TABS: 500.0000 mg | ORAL_TABLET | Freq: Two times a day (BID) | ORAL | Status: DC

## 2015-01-22 NOTE — Telephone Encounter (Signed)
Pt notified of positive Gardnerella and RX for Flagyl sent to [facility] in [place]

## 2015-01-24 ENCOUNTER — Telehealth: Payer: Self-pay | Admitting: *Deleted

## 2015-01-24 NOTE — Telephone Encounter (Signed)
Pt called states she has felt nausea, fatigue, no appetite, and flu like Sx wanted to know if these Sx could be related to recent BV Dx. Pt has no fever, chills, and no vomiting. Adv pt to treat Sx and if no better in a week to come in for eval. Pt has B6 and unisom for nausea and will begin that again. Pt expressed understanding.

## 2015-01-30 ENCOUNTER — Inpatient Hospital Stay (HOSPITAL_COMMUNITY): Payer: BLUE CROSS/BLUE SHIELD

## 2015-01-30 ENCOUNTER — Encounter (HOSPITAL_COMMUNITY): Payer: Self-pay | Admitting: *Deleted

## 2015-01-30 ENCOUNTER — Inpatient Hospital Stay (HOSPITAL_COMMUNITY): Admit: 2015-01-30 | Payer: BLUE CROSS/BLUE SHIELD

## 2015-01-30 ENCOUNTER — Inpatient Hospital Stay (HOSPITAL_COMMUNITY)
Admission: AD | Admit: 2015-01-30 | Discharge: 2015-01-30 | Disposition: A | Discharge status: Home / Self Care | Payer: BLUE CROSS/BLUE SHIELD | Source: Ambulatory Visit | Attending: Family Medicine | Admitting: Family Medicine

## 2015-01-30 DIAGNOSIS — O9989 Other specified diseases and conditions complicating pregnancy, childbirth and the puerperium: Secondary | ICD-10-CM | POA: Insufficient documentation

## 2015-01-30 DIAGNOSIS — R109 Unspecified abdominal pain: Secondary | ICD-10-CM

## 2015-01-30 DIAGNOSIS — Z3A26 26 weeks gestation of pregnancy: Secondary | ICD-10-CM | POA: Diagnosis not present

## 2015-01-30 DIAGNOSIS — O26899 Other specified pregnancy related conditions, unspecified trimester: Secondary | ICD-10-CM

## 2015-01-30 DIAGNOSIS — Z3A27 27 weeks gestation of pregnancy: Secondary | ICD-10-CM

## 2015-01-30 DIAGNOSIS — R1030 Lower abdominal pain, unspecified: Secondary | ICD-10-CM | POA: Insufficient documentation

## 2015-01-30 HISTORY — DX: Unspecified abnormal cytological findings in specimens from vagina: R87.629

## 2015-01-30 HISTORY — DX: Irritable bowel syndrome, unspecified: K58.9

## 2015-01-30 LAB — CBC
HEMATOCRIT: 33 % — AB (ref 36.0–46.0)
HEMOGLOBIN: 11.1 g/dL — AB (ref 12.0–15.0)
MCH: 31.7 pg (ref 26.0–34.0)
MCHC: 33.6 g/dL (ref 30.0–36.0)
MCV: 94.3 fL (ref 78.0–100.0)
Platelets: 271 10*3/uL (ref 150–400)
RBC: 3.5 MIL/uL — AB (ref 3.87–5.11)
RDW: 14 % (ref 11.5–15.5)
WBC: 10.2 10*3/uL (ref 4.0–10.5)

## 2015-01-30 LAB — COMPREHENSIVE METABOLIC PANEL
ALBUMIN: 3.1 g/dL — AB (ref 3.5–5.0)
ALK PHOS: 68 U/L (ref 38–126)
ALT: 13 U/L — ABNORMAL LOW (ref 14–54)
AST: 14 U/L — AB (ref 15–41)
Anion gap: 8 (ref 5–15)
BILIRUBIN TOTAL: 0.5 mg/dL (ref 0.3–1.2)
BUN: 8 mg/dL (ref 6–20)
CALCIUM: 8.5 mg/dL — AB (ref 8.9–10.3)
CO2: 25 mmol/L (ref 22–32)
Chloride: 104 mmol/L (ref 101–111)
Creatinine, Ser: 0.62 mg/dL (ref 0.44–1.00)
GFR calc Af Amer: >60 mL/min (ref >60)
GFR calc non Af Amer: >60 mL/min (ref >60)
GLUCOSE: 74 mg/dL (ref 65–99)
Potassium: 3.7 mmol/L (ref 3.5–5.1)
Sodium: 137 mmol/L (ref 135–145)
TOTAL PROTEIN: 6.8 g/dL (ref 6.5–8.1)

## 2015-01-30 NOTE — Discharge Instructions (Signed)

## 2015-01-30 NOTE — MAU Provider Note (Signed)
History     CSN: [id number]  Arrival date and time: 01/30/15 4098   First Provider Initiated Contact with Patient 01/30/15 1052     CC: lower abdominal pain  HPI   Patient is 29 y.o. [id number] [redacted]w[redacted]d here with complaints of lower abdominal pain.  Reports lower abdominal pain starting this AM. Initially thought the pain may be gas-related, but pain has worsened throughout the past few hours. Felt unable to get out of her car at work because the pain was so severe.  She reports that the pain is located primarily in her lower abdomen but is diffuse. She has history of IBS but says this pain feels very different. She describes the pain as sharp and not crampy. She denies constipation, burning with urination or dysuria.    Past Medical History  Diagnosis Date  . Vaginal Pap smear, abnormal   . IBS (irritable bowel syndrome)     Past Surgical History  Procedure Laterality Date  . Wisdom tooth extraction      No family history on file.  Social History  Substance Use Topics  . Smoking status: Never Smoker   . Smokeless tobacco: Never Used  . Alcohol Use: No    Allergies:  Allergies  Allergen Reactions  . Amoxicillin Hives  . Morphine And Related Hives    No prescriptions prior to admission    Review of Systems  Respiratory: Negative for shortness of breath.   Cardiovascular: Negative for chest pain and palpitations.  Gastrointestinal: Positive for abdominal pain. Negative for nausea, vomiting, diarrhea and constipation.  Genitourinary: Negative for dysuria, urgency and hematuria.   Physical Exam   Blood pressure 110/67, pulse 82, temperature 98.4 F (36.9 C), temperature source Oral, resp. rate 18, height 5\' 6"  (1.676 m), weight 197 lb (89.359 kg).  Physical Exam  Constitutional: She is oriented to person, place, and time. She appears well-developed and well-nourished. She appears distressed.  HENT:  Head: Normocephalic and atraumatic.  Cardiovascular: Normal  rate, regular rhythm and normal heart sounds.   No murmur heard. Respiratory: Effort normal and breath sounds normal. No respiratory distress. She has no wheezes.  GI: Soft. There is tenderness. There is no rebound and no guarding.  Gravid  Neurological: She is alert and oriented to person, place, and time. No cranial nerve deficit.  Skin: Skin is warm and dry.  Psychiatric: She has a normal mood and affect.    MAU Course  Procedures None  MDM Reviewed patient's charts OB US - no abnormalities CBC, CMP - mild hypocalcemia, no other abnormalities  Cervical exam - 0.5 cm dilated  Assessment and Plan  A: Patient is 29 y.o. [id number] [redacted]w[redacted]d reporting abdominal pain likely secondary to pregnancy. As CBC and CMP are WNL and OB US shows no abnormalities, patient was discharged with instructions for conservative pain management measures at home.   P: Discharge home - Reviewed findings and my conclusion - Recommended Tylenol and warm baths for pain relief - Preterm labor precautions dicussed - Follow-up with OB provider   Tara Abernethy, MD PGY-1 [facility]  01/30/2015, 5:29 PM    OB FELLOW MAU DISCHARGE ATTESTATION  I have seen and examined this patient; I agree with above documentation in the resident's note.   Tara Vargas is a 29 y.o. [id number] reporting lower abdominal pain. +FM, denies LOF, VB, contractions, vaginal discharge.  PE: BP 110/67 mmHg  Pulse 82  Temp(Src) 98.4 F (36.9 C) (Oral)  Resp 18  Ht  (1.676 m)  Wt 197 lb (89.359 kg)  BMI 31.81 kg/m2 Gen: calm comfortable, NAD Resp: normal effort, no distress Abd: gravid, diffuse mild lower abdominal tenderness, no rebound  ROS, labs, PMH reviewed NST reactive   A/P: Abdominal pain. Etiology is unclear. Reactive NST, no constitutional signs/symptoms. CBC/CMP wnl; no upper abdominal pain to suggest hepatic/biliary/pancreatic etiology. No CMT; GC/chlamydia obtained. No signs of labor or  PPROM. No bleeding or signs abruption on u/s; u/s read as normal. - PTL and PPROm return precautions - f/u gc/chlamydia - bleeding and infection return precautions   Silvano Bilis, MD 5:49 PM

## 2015-01-30 NOTE — MAU Note (Signed)
Lower abdominal pain started this morning around 6am. Pt thought it was gas pain but by 0630 she describes it as excruciating pain. Pain is umbilicus and below across anterior abdomen. No vaginal bleeding. Pt just finished treatment for BV on Monday night. Denies ROM/LOF. +fetal movement. Hx of IBS but doesn't feel like its the same pain assoc. With IBS. Painful to touch. Denies contractions.

## 2015-01-31 ENCOUNTER — Encounter (HOSPITAL_COMMUNITY): Payer: Self-pay | Admitting: *Deleted

## 2015-01-31 ENCOUNTER — Encounter (HOSPITAL_COMMUNITY): Payer: Self-pay

## 2015-01-31 LAB — GC/CHLAMYDIA PROBE AMP (~~LOC~~) NOT AT ARMC
Chlamydia: NEGATIVE
Neisseria Gonorrhea: NEGATIVE

## 2015-02-15 ENCOUNTER — Ambulatory Visit (INDEPENDENT_AMBULATORY_CARE_PROVIDER_SITE_OTHER): Payer: BLUE CROSS/BLUE SHIELD | Admitting: Advanced Practice Midwife

## 2015-02-15 ENCOUNTER — Encounter: Payer: Self-pay | Admitting: Advanced Practice Midwife

## 2015-02-15 VITALS — BP 111/78 | HR 96 | Wt 205.0 lb

## 2015-02-15 DIAGNOSIS — Z36 Encounter for antenatal screening of mother: Secondary | ICD-10-CM | POA: Diagnosis not present

## 2015-02-15 DIAGNOSIS — Z3483 Encounter for supervision of other normal pregnancy, third trimester: Secondary | ICD-10-CM

## 2015-02-15 DIAGNOSIS — Z3492 Encounter for supervision of normal pregnancy, unspecified, second trimester: Secondary | ICD-10-CM

## 2015-02-15 DIAGNOSIS — Z3481 Encounter for supervision of other normal pregnancy, first trimester: Secondary | ICD-10-CM

## 2015-02-15 LAB — CBC
HCT: 34.4 % — ABNORMAL LOW (ref 36.0–46.0)
Hemoglobin: 11.5 g/dL — ABNORMAL LOW (ref 12.0–15.0)
MCH: 30.9 pg (ref 26.0–34.0)
MCHC: 33.4 g/dL (ref 30.0–36.0)
MCV: 92.5 fL (ref 78.0–100.0)
MPV: 9.3 fL (ref 8.6–12.4)
PLATELETS: 308 10*3/uL (ref 150–400)
RBC: 3.72 MIL/uL — AB (ref 3.87–5.11)
RDW: 13.1 % (ref 11.5–15.5)
WBC: 9.3 10*3/uL (ref 4.0–10.5)

## 2015-02-15 NOTE — Progress Notes (Signed)
Subjective:  Tara Vargas is a 29 y.o. G8P1060 at [redacted]w[redacted]d being seen today for ongoing prenatal care.  Patient reports no complaints.  Contractions: Irritability.  Vag. Bleeding: None. Movement: Present. Denies leaking of fluid.   The following portions of the patient's history were reviewed and updated as appropriate: allergies, current medications, past family history, past medical history, past social history, past surgical history and problem list.   Objective:   Filed Vitals:   02/15/15 0826  BP: 111/78  Pulse: 96  Weight: 205 lb (92.987 kg)    Fetal Status: Fetal Heart Rate (bpm): 158   Movement: Present     General:  Alert, oriented and cooperative. Patient is in no acute distress.  Skin: Skin is warm and dry. No rash noted.   Cardiovascular: Normal heart rate noted  Respiratory: Normal respiratory effort, no problems with respiration noted  Abdomen: Soft, gravid, appropriate for gestational age. Pain/Pressure: Absent     Pelvic: Vag. Bleeding: None Vag D/C Character: Thin   Cervical exam deferred        Extremities: Normal range of motion.  Edema: Trace  Mental Status: Normal mood and affect. Normal behavior. Normal judgment and thought content.   Urinalysis:      Assessment and Plan:  Pregnancy: G8P1060 at 108w1d  1. Supervision of normal pregnancy, second trimester  - Glucose Tolerance, 1 HR (50g) - CBC - RPR - HIV antibody  2. Encounter for supervision of other normal pregnancy in first trimester   Preterm labor symptoms and general obstetric precautions including but not limited to vaginal bleeding, contractions, leaking of fluid and fetal movement were reviewed in detail with the patient. Please refer to After Visit Summary for other counseling recommendations.  Planning TDaP at [place].  Had Rhophylac at last visit.  Return in about 2 weeks (around 03/01/2015).   Dorathy Kinsman, CNM

## 2015-02-15 NOTE — Patient Instructions (Signed)
Preterm Labor Information Preterm labor is when labor starts at less than 37 weeks of pregnancy. The normal length of a pregnancy is 39 to 41 weeks. CAUSES Often, there is no identifiable underlying cause as to why a woman goes into preterm labor. One of the most common known causes of preterm labor is infection. Infections of the uterus, cervix, vagina, amniotic sac, bladder, kidney, or even the lungs (pneumonia) can cause labor to start. Other suspected causes of preterm labor include:   Urogenital infections, such as yeast infections and bacterial vaginosis.   Uterine abnormalities (uterine shape, uterine septum, fibroids, or bleeding from the placenta).   A cervix that has been operated on (it may fail to stay closed).   Malformations in the fetus.   Multiple gestations (twins, triplets, and so on).   Breakage of the amniotic sac.  RISK FACTORS  Having a previous history of preterm labor.   Having premature rupture of membranes (PROM).   Having a placenta that covers the opening of the cervix (placenta previa).   Having a placenta that separates from the uterus (placental abruption).   Having a cervix that is too weak to hold the fetus in the uterus (incompetent cervix).   Having too much fluid in the amniotic sac (polyhydramnios).   Taking illegal drugs or smoking while pregnant.   Not gaining enough weight while pregnant.   Being younger than 62 and older than 30 years old.   Having a low socioeconomic status.   Being African American. SYMPTOMS Signs and symptoms of preterm labor include:   Menstrual-like cramps, abdominal pain, or back pain.  Uterine contractions that are regular, as frequent as six in an hour, regardless of their intensity (may be mild or painful).  Contractions that start on the top of the uterus and spread down to the lower abdomen and back.   A sense of increased pelvic pressure.   A watery or bloody mucus discharge that  comes from the vagina.  TREATMENT Depending on the length of the pregnancy and other circumstances, your health care provider may suggest bed rest. If necessary, there are medicines that can be given to stop contractions and to mature the fetal lungs. If labor happens before 34 weeks of pregnancy, a prolonged hospital stay may be recommended. Treatment depends on the condition of both you and the fetus.  WHAT SHOULD YOU DO IF YOU THINK YOU ARE IN PRETERM LABOR? Call your health care provider right away. You will need to go to the hospital to get checked immediately. HOW CAN YOU PREVENT PRETERM LABOR IN FUTURE PREGNANCIES? You should:   Stop smoking if you smoke.  Maintain healthy weight gain and avoid chemicals and drugs that are not necessary.  Be watchful for any type of infection.  Inform your health care provider if you have a known history of preterm labor. Document Released: 08/22/2003 Document Revised: 02/01/2013 Document Reviewed: 07/04/2012 [facility] Patient Information 2015 [place], [place]. This information is not intended to replace advice given to you by your health care provider. Make sure you discuss any questions you have with your health care provider.  Tdap Vaccine (Tetanus, Diphtheria, Pertussis): What You Need to Know 1. Why get vaccinated? Tetanus, diphtheria and pertussis can be very serious diseases, even for adolescents and adults. Tdap vaccine can protect [place] from these diseases. TETANUS (Lockjaw) causes painful muscle tightening and stiffness, usually all over the body.  It can lead to tightening of muscles in the head and neck so you  can't open your mouth, swallow, or sometimes even breathe. Tetanus kills about 1 out of 5 people who are infected. DIPHTHERIA can cause a thick coating to form in the back of the throat.  It can lead to breathing problems, paralysis, heart failure, and death. PERTUSSIS (Whooping Cough) causes severe coughing spells, which can cause  difficulty breathing, vomiting and disturbed sleep.  It can also lead to weight loss, incontinence, and rib fractures. Up to 2 in 100 adolescents and 5 in 100 adults with pertussis are hospitalized or have complications, which could include pneumonia or death. These diseases are caused by bacteria. Diphtheria and pertussis are spread from person to person through coughing or sneezing. Tetanus enters the body through cuts, scratches, or wounds. Before vaccines, the [place] saw as many as 200,000 cases a year of diphtheria and pertussis, and hundreds of cases of tetanus. Since vaccination began, tetanus and diphtheria have dropped by about 99% and pertussis by about 80%. 2. Tdap vaccine Tdap vaccine can protect adolescents and adults from tetanus, diphtheria, and pertussis. One dose of Tdap is routinely given at age 30 or 7112. People who did not get Tdap at that age should get it as soon as possible. Tdap is especially important for health care professionals and anyone having close contact with a baby younger than 12 months. Pregnant women should get a dose of Tdap during every pregnancy, to protect the newborn from pertussis. Infants are most at risk for severe, life-threatening complications from pertussis. A similar vaccine, called Td, protects from tetanus and diphtheria, but not pertussis. A Td booster should be given every 10 years. Tdap may be given as one of these boosters if you have not already gotten a dose. Tdap may also be given after a severe cut or burn to prevent tetanus infection. Your doctor can give you more information. Tdap may safely be given at the same time as other vaccines. 3. Some people should not get this vaccine  If you ever had a life-threatening allergic reaction after a dose of any tetanus, diphtheria, or pertussis containing vaccine, OR if you have a severe allergy to any part of this vaccine, you should not get Tdap. Tell your doctor if you have any severe  allergies.  If you had a coma, or long or multiple seizures within 7 days after a childhood dose of DTP or DTaP, you should not get Tdap, unless a cause other than the vaccine was found. You can still get Td.  Talk to your doctor if you:  have epilepsy or another nervous system problem,  had severe pain or swelling after any vaccine containing diphtheria, tetanus or pertussis,  ever had Guillain-Barr Syndrome (GBS),  aren't feeling well on the day the shot is scheduled. 4. Risks of a vaccine reaction With any medicine, including vaccines, there is a chance of side effects. These are usually mild and go away on their own, but serious reactions are also possible. Brief fainting spells can follow a vaccination, leading to injuries from falling. Sitting or lying down for about 15 minutes can help prevent these. Tell your doctor if you feel dizzy or light-headed, or have vision changes or ringing in the ears. Mild problems following Tdap (Did not interfere with activities)  Pain where the shot was given (about 3 in 4 adolescents or 2 in 3 adults)  Redness or swelling where the shot was given (about 1 person in 5)  Mild fever of at least 100.110F (up to about 1  in 25 adolescents or 1 in 100 adults)  Headache (about 3 or 4 people in 10)  Tiredness (about 1 person in 3 or 4)  Nausea, vomiting, diarrhea, stomach ache (up to 1 in 4 adolescents or 1 in 10 adults)  Chills, body aches, sore joints, rash, swollen glands (uncommon) Moderate problems following Tdap (Interfered with activities, but did not require medical attention)  Pain where the shot was given (about 1 in 5 adolescents or 1 in 100 adults)  Redness or swelling where the shot was given (up to about 1 in 16 adolescents or 1 in 25 adults)  Fever over 102F (about 1 in 100 adolescents or 1 in 250 adults)  Headache (about 3 in 20 adolescents or 1 in 10 adults)  Nausea, vomiting, diarrhea, stomach ache (up to 1 or 3 people in  100)  Swelling of the entire arm where the shot was given (up to about 3 in 100). Severe problems following Tdap (Unable to perform usual activities; required medical attention)  Swelling, severe pain, bleeding and redness in the arm where the shot was given (rare). A severe allergic reaction could occur after any vaccine (estimated less than 1 in a million doses). 5. What if there is a serious reaction? What should I look for?  Look for anything that concerns you, such as signs of a severe allergic reaction, very high fever, or behavior changes. Signs of a severe allergic reaction can include hives, swelling of the face and throat, difficulty breathing, a fast heartbeat, dizziness, and weakness. These would start a few minutes to a few hours after the vaccination. What should I do?  If you think it is a severe allergic reaction or other emergency that can't wait, call 9-1-1 or get the person to the nearest hospital. Otherwise, call your doctor.  Afterward, the reaction should be reported to the "Vaccine Adverse Event Reporting System" (VAERS). Your doctor might file this report, or you can do it yourself through the VAERS web site at www.vaers.LAgents.no, or by calling 1-512 437 5051. VAERS is only for reporting reactions. They do not give medical advice.  6. The National Vaccine Injury Compensation Program The [employer] Vaccine Injury Compensation Program (VICP) is a federal program that was created to compensate people who may have been injured by certain vaccines. Persons who believe they may have been injured by a vaccine can learn about the program and about filing a claim by calling 1-317-342-1362 or visiting the VICP website at SpiritualWord.at. 7. How can I learn more?  Ask your doctor.  Call your local or state health department.  Contact the Centers for Disease Control and Prevention (CDC):  Call 508-135-3911 or visit CDC's website at PicCapture.uy. CDC  Tdap Vaccine VIS (10/22/11) Document Released: 12/01/2011 Document Revised: 10/16/2013 Document Reviewed: 09/13/2013 [employer] Patient Information 2015 [place], [place]. This information is not intended to replace advice given to you by your health care provider. Make sure you discuss any questions you have with your health care provider.

## 2015-02-16 LAB — RPR

## 2015-02-16 LAB — GLUCOSE TOLERANCE, 1 HOUR (50G) W/O FASTING: Glucose, 1 Hour GTT: 76 mg/dL (ref 70–140)

## 2015-02-16 LAB — HIV ANTIBODY (ROUTINE TESTING W REFLEX): HIV 1&2 Ab, 4th Generation: NONREACTIVE

## 2015-02-19 ENCOUNTER — Telehealth: Payer: Self-pay | Admitting: *Deleted

## 2015-02-19 NOTE — Telephone Encounter (Signed)
LM on voicemail of normal 1 hr GTT. 

## 2015-03-01 ENCOUNTER — Ambulatory Visit (INDEPENDENT_AMBULATORY_CARE_PROVIDER_SITE_OTHER): Payer: BLUE CROSS/BLUE SHIELD | Admitting: Advanced Practice Midwife

## 2015-03-01 VITALS — BP 121/78 | HR 111 | Wt 208.0 lb

## 2015-03-01 DIAGNOSIS — N898 Other specified noninflammatory disorders of vagina: Secondary | ICD-10-CM

## 2015-03-01 DIAGNOSIS — O26893 Other specified pregnancy related conditions, third trimester: Secondary | ICD-10-CM

## 2015-03-01 DIAGNOSIS — Z3481 Encounter for supervision of other normal pregnancy, first trimester: Secondary | ICD-10-CM

## 2015-03-01 DIAGNOSIS — R319 Hematuria, unspecified: Secondary | ICD-10-CM

## 2015-03-01 NOTE — Patient Instructions (Signed)
Braxton Hicks Contractions Contractions of the uterus can occur throughout pregnancy. Contractions are not always a sign that you are in labor.  WHAT ARE BRAXTON HICKS CONTRACTIONS?  Contractions that occur before labor are called Braxton Hicks contractions, or false labor. Toward the end of pregnancy (32-34 weeks), these contractions can develop more often and may become more forceful. This is not true labor because these contractions do not result in opening (dilatation) and thinning of the cervix. They are sometimes difficult to tell apart from true labor because these contractions can be forceful and people have different pain tolerances. You should not feel embarrassed if you go to the hospital with false labor. Sometimes, the only way to tell if you are in true labor is for your health care provider to look for changes in the cervix. If there are no prenatal problems or other health problems associated with the pregnancy, it is completely safe to be sent home with false labor and await the onset of true labor. HOW CAN YOU TELL THE DIFFERENCE BETWEEN TRUE AND FALSE LABOR? False Labor  The contractions of false labor are usually shorter and not as hard as those of true labor.   The contractions are usually irregular.   The contractions are often felt in the front of the lower abdomen and in the groin.   The contractions may go away when you walk around or change positions while lying down.   The contractions get weaker and are shorter lasting as time goes on.   The contractions do not usually become progressively stronger, regular, and closer together as with true labor.  True Labor  Contractions in true labor last 30-70 seconds, become very regular, usually become more intense, and increase in frequency.   The contractions do not go away with walking.   The discomfort is usually felt in the top of the uterus and spreads to the lower abdomen and low back.   True labor can be  determined by your health care provider with an exam. This will show that the cervix is dilating and getting thinner.  WHAT TO REMEMBER  Keep up with your usual exercises and follow other instructions given by your health care provider.   Take medicines as directed by your health care provider.   Keep your regular prenatal appointments.   Eat and drink lightly if you think you are going into labor.   If Braxton Hicks contractions are making you uncomfortable:   Change your position from lying down or resting to walking, or from walking to resting.   Sit and rest in a tub of warm water.   Drink 2-3 glasses of water. Dehydration may cause these contractions.   Do slow and deep breathing several times an hour.  WHEN SHOULD I SEEK IMMEDIATE MEDICAL CARE? Seek immediate medical care if:  Your contractions become stronger, more regular, and closer together.   You have fluid leaking or gushing from your vagina.   You have a fever.   You pass blood-tinged mucus.   You have vaginal bleeding.   You have continuous abdominal pain.   You have low back pain that you never had before.   You feel your baby's head pushing down and causing pelvic pressure.   Your baby is not moving as much as it used to.  Document Released: 06/01/2005 Document Revised: 06/06/2013 Document Reviewed: 03/13/2013 Exit[employer] Patient Information 2015 [employer]. This information is not intended to replace advice given to you by your health care   provider. Make sure you discuss any questions you have with your health care provider. ° °Pregnancy and Urinary Tract Infection °A urinary tract infection (UTI) is a bacterial infection of the urinary tract. Infection of the urinary tract can include the ureters, kidneys (pyelonephritis), bladder (cystitis), and urethra (urethritis). All pregnant women should be screened for bacteria in the urinary tract. Identifying and treating a UTI will decrease the  risk of preterm labor and developing more serious infections in both the mother and baby. °CAUSES °Bacteria germs cause almost all UTIs.  °RISK FACTORS °Many factors can increase your chances of getting a UTI during pregnancy. These include: °· Having a short urethra. °· Poor toilet and hygiene habits. °· Sexual intercourse. °· Blockage of urine along the urinary tract. °· Problems with the pelvic muscles or nerves. °· Diabetes. °· Obesity. °· Bladder problems after having several children. °· Previous history of UTI. °SIGNS AND SYMPTOMS  °· Pain, burning, or a stinging feeling when urinating. °· Suddenly feeling the need to urinate right away (urgency). °· Loss of bladder control (urinary incontinence). °· Frequent urination, more than is common with pregnancy. °· Lower abdominal or back discomfort. °· Cloudy urine. °· Blood in the urine (hematuria). °· Fever.  °When the kidneys are infected, the symptoms may be: °· Back pain. °· Flank pain on the right side more so than the left. °· Fever. °· Chills. °· Nausea. °· Vomiting. °DIAGNOSIS  °A urinary tract infection is usually diagnosed through urine tests. Additional tests and procedures are sometimes done. These may include: °· Ultrasound exam of the kidneys, ureters, bladder, and urethra. °· Looking in the bladder with a lighted tube (cystoscopy). °TREATMENT °Typically, UTIs can be treated with antibiotic medicines.  °HOME CARE INSTRUCTIONS  °· Only take over-the-counter or prescription medicines as directed by your health care provider. If you were prescribed antibiotics, take them as directed. Finish them even if you start to feel better. °· Drink enough fluids to keep your urine clear or pale yellow. °· Do not have sexual intercourse until the infection is gone and your health care provider says it is okay. °· Make sure you are tested for UTIs throughout your pregnancy. These infections often come back.  °Preventing a UTI in the Future °· Practice good toilet  habits. Always wipe from front to back. Use the tissue only once. °· Do not hold your urine. Empty your bladder as soon as possible when the urge comes. °· Do not douche or use deodorant sprays. °· Wash with soap and warm water around the genital area and the anus. °· Empty your bladder before and after sexual intercourse. °· Wear underwear with a cotton crotch. °· Avoid caffeine and carbonated drinks. They can irritate the bladder. °· Drink cranberry juice or take cranberry pills. This may decrease the risk of getting a UTI. °· Do not drink alcohol. °· Keep all your appointments and tests as scheduled.  °SEEK MEDICAL CARE IF:  °· Your symptoms get worse. °· You are still having fevers 2 or more days after treatment begins. °· You have a rash. °· You feel that you are having problems with medicines prescribed. °· You have abnormal vaginal discharge. °SEEK IMMEDIATE MEDICAL CARE IF:  °· You have back or flank pain. °· You have chills. °· You have blood in your urine. °· You have nausea and vomiting. °· You have contractions of your uterus. °· You have a gush of fluid from the vagina. °MAKE SURE YOU: °· Understand these instructions.   °·   Will watch your condition.   °· Will get help right away if you are not doing well or get worse.   °Document Released: 09/26/2010 Document Revised: 03/22/2013 Document Reviewed: 12/29/2012 °ExitCare® Patient Information ©2015 [employer]. This information is not intended to replace advice given to you by your health care provider. Make sure you discuss any questions you have with your health care provider. ° °

## 2015-03-01 NOTE — Progress Notes (Signed)
C/o needing to wear a pad due to frequent leaking x 1 week. Not sure if it is urine, discharge or fluid.   Subjective:  Tara Vargas is a 29 y.o. G8P1060 at [redacted]w[redacted]d being seen today for ongoing prenatal care.  Patient reports C/o needing to wear a pad due to frequent leaking x 1 week. Not sure if it is urine, discharge or fluid.   .  Contractions: Irritability.  Vag. Bleeding: None. Movement: Present. Denies dysuria, urgency, frequency, hematuria.    The following portions of the patient's history were reviewed and updated as appropriate: allergies, current medications, past family history, past medical history, past social history, past surgical history and problem list.   Objective:   Filed Vitals:   03/01/15 0853  BP: 121/78  Pulse: 111  Weight: 208 lb (94.348 kg)    Fetal Status: Fetal Heart Rate (bpm): 153 Fundal Height: 32 cm Movement: Present     General:  Alert, oriented and cooperative. Patient is in no acute distress.  Skin: Skin is warm and dry. No rash noted.   Cardiovascular: Normal heart rate noted  Respiratory: Normal respiratory effort, no problems with respiration noted  Abdomen: Soft, gravid, appropriate for gestational age. Pain/Pressure: Absent     Pelvic: Vag. Bleeding: None Vag D/C Character: White  Neg pooling Cervical exam performed Dilation: Closed Effacement (%): 0 Station: Ballotable  Extremities: Normal range of motion.  Edema: Trace  Mental Status: Normal mood and affect. Normal behavior. Normal judgment and thought content.   Urinalysis: Urine Protein: Negative Urine Glucose: Negative  Large RBCs, otherwise normal Neg fern  Assessment and Plan:  Pregnancy: G8P1060 at [redacted]w[redacted]d  1. Hematuria  - Culture, OB Urine  2. Supervision of normal rpegnancy  Preterm labor symptoms and general obstetric precautions including but not limited to vaginal bleeding, contractions, leaking of fluid and fetal movement were reviewed in detail with the patient. Please  refer to After Visit Summary for other counseling recommendations.   Return in about 2 weeks (around 03/15/2015).   Dorathy Kinsman, CNM

## 2015-03-02 LAB — CULTURE, OB URINE
Colony Count: NO GROWTH
Organism ID, Bacteria: NO GROWTH

## 2015-03-05 ENCOUNTER — Telehealth: Payer: Self-pay

## 2015-03-05 NOTE — Telephone Encounter (Signed)
Pt called to get test results from Urine Culture. Test was negative and pt do not have a UTI. Pt understands.

## 2015-03-15 ENCOUNTER — Ambulatory Visit (INDEPENDENT_AMBULATORY_CARE_PROVIDER_SITE_OTHER): Payer: BLUE CROSS/BLUE SHIELD | Admitting: Family

## 2015-03-15 VITALS — BP 122/77 | HR 93 | Wt 211.0 lb

## 2015-03-15 DIAGNOSIS — Z3481 Encounter for supervision of other normal pregnancy, first trimester: Secondary | ICD-10-CM

## 2015-03-15 DIAGNOSIS — Z23 Encounter for immunization: Secondary | ICD-10-CM | POA: Diagnosis not present

## 2015-03-15 MED ORDER — TETANUS-DIPHTH-ACELL PERTUSSIS 5-2.5-18.5 LF-MCG/0.5 IM SUSP
0.5000 mL | Freq: Once | INTRAMUSCULAR | Status: AC
  Administered 2015-03-15: 0.5 mL via INTRAMUSCULAR

## 2015-03-15 NOTE — Progress Notes (Signed)
Tdap today

## 2015-03-15 NOTE — Progress Notes (Signed)
Subjective:  Tara Vargas is a 29 y.o. G8P1060 at [redacted]w[redacted]d being seen today for ongoing prenatal care.  Patient reports no complaints.  Contractions: Irritability.  Vag. Bleeding: None. Movement: Present. Denies leaking of fluid.   The following portions of the patient's history were reviewed and updated as appropriate: allergies, current medications, past family history, past medical history, past social history, past surgical history and problem list.   Objective:   Filed Vitals:   03/15/15 0846  BP: 122/77  Pulse: 93  Weight: 211 lb (95.709 kg)    Fetal Status: Fetal Heart Rate (bpm): 155   Movement: Present     General:  Alert, oriented and cooperative. Patient is in no acute distress.  Skin: Skin is warm and dry. No rash noted.   Cardiovascular: Normal heart rate noted  Respiratory: Normal respiratory effort, no problems with respiration noted  Abdomen: Soft, gravid, appropriate for gestational age. Pain/Pressure: Present     Pelvic: Vag. Bleeding: None Vag D/C Character: Thin   Cervical exam deferred        Extremities: Normal range of motion.  Edema: Trace  Mental Status: Normal mood and affect. Normal behavior. Normal judgment and thought content.   Urinalysis: Urine Protein: Negative Urine Glucose: Negative  Assessment and Plan:  Pregnancy: G8P1060 at [redacted]w[redacted]d  1. Immunization due - Tdap (BOOSTRIX) injection 0.5 mL; Inject 0.5 mLs into the muscle once.  2. Supervision of Normal Pregnancy - Attended Sep 2016 waterbirth class; utilizing doula for tub [facility]); reviewed birth plan  Preterm labor symptoms and general obstetric precautions including but not limited to vaginal bleeding, contractions, leaking of fluid and fetal movement were reviewed in detail with the patient. Please refer to After Visit Summary for other counseling recommendations.  Return in about 2 weeks (around 03/29/2015).   Eino Farber Kennith Gain, CNM

## 2015-03-29 ENCOUNTER — Encounter: Payer: Self-pay | Admitting: *Deleted

## 2015-03-29 ENCOUNTER — Ambulatory Visit (INDEPENDENT_AMBULATORY_CARE_PROVIDER_SITE_OTHER): Payer: BLUE CROSS/BLUE SHIELD | Admitting: Advanced Practice Midwife

## 2015-03-29 VITALS — BP 117/73 | HR 100 | Wt 219.0 lb

## 2015-03-29 DIAGNOSIS — Z3481 Encounter for supervision of other normal pregnancy, first trimester: Secondary | ICD-10-CM

## 2015-03-29 NOTE — Progress Notes (Signed)
Subjective:  Tara Vargas is a 29 y.o. G8P1060 at 8671w1d being seen today for ongoing prenatal care.  Patient reports no complaints.  Contractions: Irritability.  Vag. Bleeding: None. Movement: Present. Denies leaking of fluid.   The following portions of the patient's history were reviewed and updated as appropriate: allergies, current medications, past family history, past medical history, past social history, past surgical history and problem list. Problem list updated.  Objective:   Filed Vitals:   03/29/15 0843  BP: 117/73  Pulse: 100  Weight: 99.338 kg (219 lb)    Fetal Status: Fetal Heart Rate (bpm): 141   Movement: Present     General:  Alert, oriented and cooperative. Patient is in no acute distress.  Skin: Skin is warm and dry. No rash noted.   Cardiovascular: Normal heart rate noted  Respiratory: Normal respiratory effort, no problems with respiration noted  Abdomen: Soft, gravid, appropriate for gestational age. Pain/Pressure: Present     Pelvic: Vag. Bleeding: None Vag D/C Character: Thin   Cervical exam deferred        Extremities: Normal range of motion.  Edema: Trace  Mental Status: Normal mood and affect. Normal behavior. Normal judgment and thought content.   Urinalysis: Urine Protein: Negative Urine Glucose: Negative  Assessment and Plan:  Pregnancy: G8P1060 at 4471w1d  There are no diagnoses linked to this encounter. Term labor symptoms and general obstetric precautions including but not limited to vaginal bleeding, contractions, leaking of fluid and fetal movement were reviewed in detail with the patient.  Consent for waterbirth  Signed and witnessed.  [facility] including details, risks/benefits.  Pt has doula service providing tub.   She does not want RN to check cervix related to previous bad experience with first birth.  Midwife/physician to check cervix in MAU on arrival.  Please refer to After Visit Summary for other counseling  recommendations.  Return in about 2 weeks (around 04/12/2015).   Hurshel PartyLisa A Leftwich-Kirby, CNM

## 2015-03-29 NOTE — Progress Notes (Signed)
Needs waterbirth consent today

## 2015-04-05 ENCOUNTER — Encounter: Payer: Self-pay | Admitting: *Deleted

## 2015-04-05 ENCOUNTER — Ambulatory Visit (INDEPENDENT_AMBULATORY_CARE_PROVIDER_SITE_OTHER): Payer: BLUE CROSS/BLUE SHIELD | Admitting: Family

## 2015-04-05 VITALS — BP 106/81 | HR 96 | Wt 215.0 lb

## 2015-04-05 DIAGNOSIS — O368131 Decreased fetal movements, third trimester, fetus 1: Secondary | ICD-10-CM

## 2015-04-05 DIAGNOSIS — Z3481 Encounter for supervision of other normal pregnancy, first trimester: Secondary | ICD-10-CM

## 2015-04-05 DIAGNOSIS — Z3483 Encounter for supervision of other normal pregnancy, third trimester: Secondary | ICD-10-CM

## 2015-04-05 DIAGNOSIS — Z113 Encounter for screening for infections with a predominantly sexual mode of transmission: Secondary | ICD-10-CM | POA: Diagnosis not present

## 2015-04-05 LAB — OB RESULTS CONSOLE GBS: STREP GROUP B AG: NEGATIVE

## 2015-04-05 NOTE — Progress Notes (Signed)
Subjective:  Tara Vargas is a 29 y.o. G8P1060 at 6835w1d being seen today for ongoing prenatal care.  Patient reports increased number of Braxton Hicks.  Contractions: Irregular.  Vag. Bleeding: None. Movement: Present. Denies leaking of fluid.   The following portions of the patient's history were reviewed and updated as appropriate: allergies, current medications, past family history, past medical history, past social history, past surgical history and problem list. Problem list updated.  Objective:   Filed Vitals:   04/05/15 0857  BP: 106/81  Pulse: 96  Weight: 215 lb (97.523 kg)    Fetal Status:     Movement: Present   FHR 170's with doppler  General:  Alert, oriented and cooperative. Patient is in no acute distress.  Skin: Skin is warm and dry. No rash noted.   Cardiovascular: Normal heart rate noted  Respiratory: Normal respiratory effort, no problems with respiration noted  Abdomen: Soft, gravid, appropriate for gestational age. Pain/Pressure: Present     Pelvic: Vag. Bleeding: None Vag D/C Character: White   Cervical exam performed        Extremities: Normal range of motion.  Edema: None  Mental Status: Normal mood and affect. Normal behavior. Normal judgment and thought content.   Urinalysis: Urine Protein: Negative Urine Glucose: Negative  Assessment and Plan:  Pregnancy: G8P1060 at 335w1d  1. Encounter for supervision of other normal pregnancy in first trimester - Culture, beta strep (group b only) - Urine cytology ancillary only - NST > reactive - Reviewed birth plan  Preterm labor symptoms and general obstetric precautions including but not limited to vaginal bleeding, contractions, leaking of fluid and fetal movement were reviewed in detail with the patient. Please refer to After Visit Summary for other counseling recommendations.  Return in about 1 week (around 04/12/2015).   Eino FarberWalidah Kennith GainN Karim, CNM

## 2015-04-05 NOTE — Patient Instructions (Signed)

## 2015-04-07 LAB — CULTURE, BETA STREP (GROUP B ONLY)

## 2015-04-08 LAB — URINE CYTOLOGY ANCILLARY ONLY
Chlamydia: NEGATIVE
Neisseria Gonorrhea: NEGATIVE

## 2015-04-12 ENCOUNTER — Ambulatory Visit (INDEPENDENT_AMBULATORY_CARE_PROVIDER_SITE_OTHER): Payer: BLUE CROSS/BLUE SHIELD | Admitting: Advanced Practice Midwife

## 2015-04-12 VITALS — BP 119/71 | HR 104 | Wt 218.0 lb

## 2015-04-12 DIAGNOSIS — Z3483 Encounter for supervision of other normal pregnancy, third trimester: Secondary | ICD-10-CM

## 2015-04-12 DIAGNOSIS — Z3481 Encounter for supervision of other normal pregnancy, first trimester: Secondary | ICD-10-CM

## 2015-04-12 MED ORDER — IBUPROFEN 600 MG PO TABS: 600.0000 mg | ORAL_TABLET | Freq: Four times a day (QID) | ORAL | Status: DC | PRN

## 2015-04-12 NOTE — Patient Instructions (Signed)
Braxton Hicks Contractions °Contractions of the uterus can occur throughout pregnancy. Contractions are not always a sign that you are in labor.  °WHAT ARE BRAXTON HICKS CONTRACTIONS?  °Contractions that occur before labor are called Braxton Hicks contractions, or false labor. Toward the end of pregnancy (32-34 weeks), these contractions can develop more often and may become more forceful. This is not true labor because these contractions do not result in opening (dilatation) and thinning of the cervix. They are sometimes difficult to tell apart from true labor because these contractions can be forceful and people have different pain tolerances. You should not feel embarrassed if you go to the hospital with false labor. Sometimes, the only way to tell if you are in true labor is for your health care provider to look for changes in the cervix. °If there are no prenatal problems or other health problems associated with the pregnancy, it is completely safe to be sent home with false labor and await the onset of true labor. °HOW CAN YOU TELL THE DIFFERENCE BETWEEN TRUE AND FALSE LABOR? °False Labor °· The contractions of false labor are usually shorter and not as hard as those of true labor.   °· The contractions are usually irregular.   °· The contractions are often felt in the front of the lower abdomen and in the groin.   °· The contractions may go away when you walk around or change positions while lying down.   °· The contractions get weaker and are shorter lasting as time goes on.   °· The contractions do not usually become progressively stronger, regular, and closer together as with true labor.   °True Labor °· Contractions in true labor last 30-70 seconds, become very regular, usually become more intense, and increase in frequency.   °· The contractions do not go away with walking.   °· The discomfort is usually felt in the top of the uterus and spreads to the lower abdomen and low back.   °· True labor can be  determined by your health care provider with an exam. This will show that the cervix is dilating and getting thinner.   °WHAT TO REMEMBER °· Keep up with your usual exercises and follow other instructions given by your health care provider.   °· Take medicines as directed by your health care provider.   °· Keep your regular prenatal appointments.   °· Eat and drink lightly if you think you are going into labor.   °· If Braxton Hicks contractions are making you uncomfortable:   °¨ Change your position from lying down or resting to walking, or from walking to resting.   °¨ Sit and rest in a tub of warm water.   °¨ Drink 2-3 glasses of water. Dehydration may cause these contractions.   °¨ Do slow and deep breathing several times an hour.   °WHEN SHOULD I SEEK IMMEDIATE MEDICAL CARE? °Seek immediate medical care if: °· Your contractions become stronger, more regular, and closer together.   °· You have fluid leaking or gushing from your vagina.   °· You have a fever.   °· You pass blood-tinged mucus.   °· You have vaginal bleeding.   °· You have continuous abdominal pain.   °· You have low back pain that you never had before.   °· You feel your baby's head pushing down and causing pelvic pressure.   °· Your baby is not moving as much as it used to.   °  °This information is not intended to replace advice given to you by your health care provider. Make sure you discuss any questions you have with your health care   provider. °  °Document Released: 06/01/2005 Document Revised: 06/06/2013 Document Reviewed: 03/13/2013 °Elsevier Interactive Patient Education ©2016 [employer] ° °

## 2015-04-12 NOTE — Progress Notes (Signed)
Subjective:  Tara Vargas is a 29 y.o. G8P1060 at 933w1d being seen today for ongoing prenatal care.  Patient reports no complaints.  Contractions: Irregular.  Vag. Bleeding: None. Movement: Present. Denies leaking of fluid.   The following portions of the patient's history were reviewed and updated as appropriate: allergies, current medications, past family history, past medical history, past social history, past surgical history and problem list. Problem list updated.  Objective:   Filed Vitals:   04/12/15 0904  BP: 119/71  Pulse: 104  Weight: 218 lb (98.884 kg)    Fetal Status:   Fundal Height: 37 cm Movement: Present  Presentation: Vertex  General:  Alert, oriented and cooperative. Patient is in no acute distress.  Skin: Skin is warm and dry. No rash noted.   Cardiovascular: Normal heart rate noted  Respiratory: Normal respiratory effort, no problems with respiration noted  Abdomen: Soft, gravid, appropriate for gestational age. Pain/Pressure: Present     Pelvic: Vag. Bleeding: None Vag D/C Character: White   Cervical exam deferred        Extremities: Normal range of motion.  Edema: None  Mental Status: Normal mood and affect. Normal behavior. Normal judgment and thought content.   Urinalysis:      Assessment and Plan:  Pregnancy: G8P1060 at 4933w1d  There are no diagnoses linked to this encounter. Preterm labor symptoms and general obstetric precautions including but not limited to vaginal bleeding, contractions, leaking of fluid and fetal movement were reviewed in detail with the patient. Please refer to After Visit Summary for other counseling recommendations.  Return in about 1 week (around 04/19/2015).   Dorathy KinsmanVirginia Chasmine Lender, CNM

## 2015-04-19 ENCOUNTER — Ambulatory Visit (INDEPENDENT_AMBULATORY_CARE_PROVIDER_SITE_OTHER): Payer: BLUE CROSS/BLUE SHIELD | Admitting: Advanced Practice Midwife

## 2015-04-19 VITALS — BP 116/75 | HR 92 | Wt 221.0 lb

## 2015-04-19 DIAGNOSIS — Z3481 Encounter for supervision of other normal pregnancy, first trimester: Secondary | ICD-10-CM

## 2015-04-19 DIAGNOSIS — Z789 Other specified health status: Secondary | ICD-10-CM

## 2015-04-19 DIAGNOSIS — Z3483 Encounter for supervision of other normal pregnancy, third trimester: Secondary | ICD-10-CM

## 2015-04-19 MED ORDER — BREAST PUMP MISC: Status: DC

## 2015-04-19 NOTE — Patient Instructions (Signed)
Vaginal Delivery °During delivery, your health care provider will help you give birth to your baby. During a vaginal delivery, you will work to push the baby out of your vagina. However, before you can push your baby out, a few things need to happen. The opening of your uterus (cervix) has to soften, thin out, and open up (dilate) all the way to 10 cm. Also, your baby has to move down from the uterus into your vagina.  °SIGNS OF LABOR  °Your health care provider will first need to make sure you are in labor. Signs of labor include:  °· Passing what is called the mucous plug before labor begins. This is a small amount of blood-stained mucus. °· Having regular, painful uterine contractions.   °· The time between contractions gets shorter.   °· The discomfort and pain gradually get more intense. °· Contraction pains get worse when walking and do not go away when resting.   °· Your cervix becomes thinner (effacement) and dilates. °BEFORE THE DELIVERY °Once you are in labor and admitted into the hospital or care center, your health care provider may do the following:  °· Perform a complete physical exam. °· Review any complications related to pregnancy or labor.  °· Check your blood pressure, pulse, temperature, and heart rate (vital signs).   °· Determine if, and when, the rupture of amniotic membranes occurred. °· Do a vaginal exam (using a sterile glove and lubricant) to determine:   °¨ The position (presentation) of the baby. Is the baby's head presenting first (vertex) in the birth canal (vagina), or are the feet or buttocks first (breech)?   °¨ The level (station) of the baby's head within the birth canal.   °¨ The effacement and dilatation of the cervix.   °· An electronic fetal monitor is usually placed on your abdomen when you first arrive. This is used to monitor your contractions and the baby's heart rate. °¨ When the monitor is on your abdomen (external fetal monitor), it can only pick up the frequency and  length of your contractions. It cannot tell the strength of your contractions. °¨ If it becomes necessary for your health care provider to know exactly how strong your contractions are or to see exactly what the baby's heart rate is doing, an internal monitor may be inserted into your vagina and uterus. Your health care provider will discuss the benefits and risks of using an internal monitor and obtain your permission before inserting the device. °¨ Continuous fetal monitoring may be needed if you have an epidural, are receiving certain medicines (such as oxytocin), or have pregnancy or labor complications. °· An IV access tube may be placed into a vein in your arm to deliver fluids and medicines if necessary. °THREE STAGES OF LABOR AND DELIVERY °Normal labor and delivery is divided into three stages. °First Stage °This stage starts when you begin to contract regularly and your cervix begins to efface and dilate. It ends when your cervix is completely open (fully dilated). The first stage is the longest stage of labor and can last from 3 hours to 15 hours.  °Several methods are available to help with labor pain. You and your health care provider will decide which option is best for you. Options include:  °· Opioid medicines. These are strong pain medicines that you can get through your IV tube or as a shot into your muscle. These medicines lessen pain but do not make it go away completely.  °· Epidural. A medicine is given through a thin tube that   is inserted in your back. The medicine numbs the lower part of your body and prevents any pain in that area. °· Paracervical pain medicine. This is an injection of an anesthetic on each side of your cervix.   °· You may request natural childbirth, which does not involve the use of pain medicines or an epidural during labor and delivery. Instead, you will use other things, such as breathing exercises, to help cope with the pain. °Second Stage °The second stage of labor  begins when your cervix is fully dilated at 10 cm. It continues until you push your baby down through the birth canal and the baby is born. This stage can take only minutes or several hours. °· The location of your baby's head as it moves through the birth canal is reported as a number called a station. If the baby's head has not started its descent, the station is described as being at minus 3 (-3). When your baby's head is at the zero station, it is at the middle of the birth canal and is engaged in the pelvis. The station of your baby helps indicate the progress of the second stage of labor. °· When your baby is born, your health care provider may hold the baby with his or her head lowered to prevent amniotic fluid, mucus, and blood from getting into the baby's lungs. The baby's mouth and nose may be suctioned with a small bulb syringe to remove any additional fluid. °· Your health care provider may then place the baby on your stomach. It is important to keep the baby from getting cold. To do this, the health care provider will dry the baby off, place the baby directly on your skin (with no blankets between you and the baby), and cover the baby with warm, dry blankets.   °· The umbilical cord is cut. °Third Stage °During the third stage of labor, your health care provider will deliver the placenta (afterbirth) and make sure your bleeding is under control. The delivery of the placenta usually takes about 5 minutes but can take up to 30 minutes. After the placenta is delivered, a medicine may be given either by IV or injection to help contract the uterus and control bleeding. If you are planning to breastfeed, you can try to do so now. °After you deliver the placenta, your uterus should contract and get very firm. If your uterus does not remain firm, your health care provider will massage it. This is important because the contraction of the uterus helps cut off bleeding at the site where the placenta was attached  to your uterus. If your uterus does not contract properly and stay firm, you may continue to bleed heavily. If there is a lot of bleeding, medicines may be given to contract the uterus and stop the bleeding.  °  °This information is not intended to replace advice given to you by your health care provider. Make sure you discuss any questions you have with your health care provider. °  °Document Released: 03/10/2008 Document Revised: 06/22/2014 Document Reviewed: 01/27/2012 °Elsevier Interactive Patient Education ©2016 Else[employer] Inc. ° °

## 2015-04-19 NOTE — Progress Notes (Signed)
Subjective:  Tara Vargas is a 29 y.o. G8P1060 at 3211w1d being seen today for ongoing prenatal care.  Patient reports occasional contractions.  Contractions: Irregular.  Vag. Bleeding: None. Movement: Present. Denies leaking of fluid.   The following portions of the patient's history were reviewed and updated as appropriate: allergies, current medications, past family history, past medical history, past social history, past surgical history and problem list. Problem list updated.  Objective:   Filed Vitals:   04/19/15 0847  BP: 116/75  Pulse: 92  Weight: 221 lb (100.245 kg)    Fetal Status:     Movement: Present     General:  Alert, oriented and cooperative. Patient is in no acute distress.  Skin: Skin is warm and dry. No rash noted.   Cardiovascular: Normal heart rate noted  Respiratory: Normal respiratory effort, no problems with respiration noted  Abdomen: Soft, gravid, appropriate for gestational age. Pain/Pressure: Present     Pelvic: Vag. Bleeding: None Vag D/C Character: Thin   Cervical exam performed      0.5/long/-3, vtx  Extremities: Normal range of motion.  Edema: None  Mental Status: Normal mood and affect. Normal behavior. Normal judgment and thought content.   Urinalysis: Urine Protein: Negative Urine Glucose: Negative  Assessment and Plan:  Pregnancy: G8P1060 at 5511w1d  There are no diagnoses linked to this encounter. Term labor symptoms and general obstetric precautions including but not limited to vaginal bleeding, contractions, leaking of fluid and fetal movement were reviewed in detail with the patient. Please refer to After Visit Summary for other counseling recommendations.  F/u 1 week   Dorathy KinsmanVirginia Mekhi Lascola, [place]

## 2015-04-19 NOTE — Addendum Note (Signed)
Addended by: Dorathy KinsmanSMITH, Loy Little on: 04/19/2015 09:53 AM   Modules accepted: Orders

## 2015-04-26 ENCOUNTER — Ambulatory Visit (INDEPENDENT_AMBULATORY_CARE_PROVIDER_SITE_OTHER): Payer: BLUE CROSS/BLUE SHIELD | Admitting: Certified Nurse Midwife

## 2015-04-26 VITALS — BP 120/75 | HR 92 | Wt 222.0 lb

## 2015-04-26 DIAGNOSIS — Z3483 Encounter for supervision of other normal pregnancy, third trimester: Secondary | ICD-10-CM

## 2015-04-26 NOTE — Progress Notes (Signed)
Subjective:  Tara Vargas is a 29 y.o. G8P1060 at 3849w1d being seen today for ongoing prenatal care.  Patient reports no complaints.  Contractions: Irregular.  Vag. Bleeding: None. Movement: Present. Denies leaking of fluid.   The following portions of the patient's history were reviewed and updated as appropriate: allergies, current medications, past family history, past medical history, past social history, past surgical history and problem list. Problem list updated.  Objective:   Filed Vitals:   04/26/15 0941  BP: 120/75  Pulse: 92  Weight: 222 lb (100.699 kg)    Fetal Status: Fetal Heart Rate (bpm): 153 Fundal Height: 40 cm Movement: Present     General:  Alert, oriented and cooperative. Patient is in no acute distress.  Skin: Skin is warm and dry. No rash noted.   Cardiovascular: Normal heart rate noted  Respiratory: Normal respiratory effort, no problems with respiration noted  Abdomen: Soft, gravid, appropriate for gestational age. Pain/Pressure: Present     Pelvic: Vag. Bleeding: None Vag D/C Character: Thin   Cervical exam deferred Dilation: 1 Effacement (%): Thick Station: -3  Extremities: Normal range of motion.  Edema: None  Mental Status: Normal mood and affect. Normal behavior. Normal judgment and thought content.   Urinalysis: Urine Protein: Negative Urine Glucose: Negative  Assessment and Plan:  Pregnancy: G8P1060 at 3149w1d  There are no diagnoses linked to this encounter. Term labor symptoms and general obstetric precautions including but not limited to vaginal bleeding, contractions, leaking of fluid and fetal movement were reviewed in detail with the patient. Please refer to After Visit Summary for other counseling recommendations.  Return in about 1 week (around 05/03/2015) for schedule nst.   Rhea PinkLori A Clemmons, CNM

## 2015-05-03 ENCOUNTER — Ambulatory Visit (INDEPENDENT_AMBULATORY_CARE_PROVIDER_SITE_OTHER): Payer: BLUE CROSS/BLUE SHIELD | Admitting: Obstetrics and Gynecology

## 2015-05-03 ENCOUNTER — Encounter: Payer: Self-pay | Admitting: Obstetrics and Gynecology

## 2015-05-03 VITALS — BP 130/85 | HR 112 | Wt 225.0 lb

## 2015-05-03 DIAGNOSIS — O09293 Supervision of pregnancy with other poor reproductive or obstetric history, third trimester: Secondary | ICD-10-CM

## 2015-05-03 DIAGNOSIS — E669 Obesity, unspecified: Secondary | ICD-10-CM

## 2015-05-03 DIAGNOSIS — O09299 Supervision of pregnancy with other poor reproductive or obstetric history, unspecified trimester: Secondary | ICD-10-CM | POA: Insufficient documentation

## 2015-05-03 DIAGNOSIS — O99213 Obesity complicating pregnancy, third trimester: Secondary | ICD-10-CM

## 2015-05-03 NOTE — Progress Notes (Signed)
Pt does not want NST today.  Pt does not want to see Illene BolusLori Clemmons, CNM for thwe remainder of her prgnancy

## 2015-05-03 NOTE — Patient Instructions (Signed)

## 2015-05-03 NOTE — Progress Notes (Signed)
Subjective:  Tara Vargas is a 29 y.o. [id number] at 4030w1d being seen today for ongoing prenatal care.  Patient reports no complaints. No show orVB. God FM.  .   .  . Denies leaking of fluid.   The following portions of the patient's history were reviewed and updated as appropriate: allergies, current medications, past family history, past medical history, past social history, past surgical history and problem list. Problem list updated.  Objective:  There were no vitals filed for this visit.  Fetal Status:           General:  Alert, oriented and cooperative. Patient is in no acute distress.  Skin: Skin is warm and dry. No rash noted.   Cardiovascular: Normal heart rate noted  Respiratory: Normal respiratory effort, no problems with respiration noted  Abdomen: Soft, gravid, appropriate for gestational age.     EFW 8 1/2-9  Pelvic:       Cervical exam performed      declined  Extremities: Normal range of motion.     Mental Status: Normal mood and affect. Normal behavior. Normal judgment and thought content.   Urinalysis:      Assessment and Plan:  Pregnancy: [id number] at 3030w1d   Doing well. Plans waterbirth with Sharen CounterLisa Leftwich-Kirby CNM  Term labor symptoms and general obstetric precautions including but not limited to vaginal bleeding, contractions, leaking of fluid and fetal movement were reviewed in detail with the patient. Please refer to After Visit Summary for other counseling recommendations.  Return in about 1 week (around 05/10/2015). Fetal testing 2x/wk Watch BP  Gagandeep Pettet Colin Mulders Giara Mcgaughey, CNM

## 2015-05-06 ENCOUNTER — Inpatient Hospital Stay (HOSPITAL_COMMUNITY)
Admission: AD | Admit: 2015-05-06 | Discharge: 2015-05-08 | DRG: 775 | Disposition: A | Discharge status: Home / Self Care | Payer: BLUE CROSS/BLUE SHIELD | Source: Ambulatory Visit | Attending: Family Medicine | Admitting: Family Medicine

## 2015-05-06 ENCOUNTER — Encounter (HOSPITAL_COMMUNITY): Payer: Self-pay | Admitting: *Deleted

## 2015-05-06 DIAGNOSIS — IMO0001 Reserved for inherently not codable concepts without codable children: Secondary | ICD-10-CM

## 2015-05-06 DIAGNOSIS — Z6791 Unspecified blood type, Rh negative: Secondary | ICD-10-CM | POA: Diagnosis present

## 2015-05-06 DIAGNOSIS — O360121 Maternal care for anti-D [Rh] antibodies, second trimester, fetus 1: Secondary | ICD-10-CM

## 2015-05-06 DIAGNOSIS — O09299 Supervision of pregnancy with other poor reproductive or obstetric history, unspecified trimester: Secondary | ICD-10-CM

## 2015-05-06 DIAGNOSIS — O26852 Spotting complicating pregnancy, second trimester: Secondary | ICD-10-CM

## 2015-05-06 DIAGNOSIS — Z3A40 40 weeks gestation of pregnancy: Secondary | ICD-10-CM

## 2015-05-06 DIAGNOSIS — O26899 Other specified pregnancy related conditions, unspecified trimester: Secondary | ICD-10-CM

## 2015-05-06 NOTE — MAU Note (Signed)
Contractions started 6pm No leaking no bleeding

## 2015-05-07 ENCOUNTER — Encounter (HOSPITAL_COMMUNITY): Payer: Self-pay

## 2015-05-07 DIAGNOSIS — Z3A40 40 weeks gestation of pregnancy: Secondary | ICD-10-CM

## 2015-05-07 DIAGNOSIS — IMO0001 Reserved for inherently not codable concepts without codable children: Secondary | ICD-10-CM

## 2015-05-07 DIAGNOSIS — Z3483 Encounter for supervision of other normal pregnancy, third trimester: Secondary | ICD-10-CM | POA: Diagnosis present

## 2015-05-07 LAB — CBC
HCT: 31 % — ABNORMAL LOW (ref 36.0–46.0)
HEMOGLOBIN: 10 g/dL — AB (ref 12.0–15.0)
MCH: 28.7 pg (ref 26.0–34.0)
MCHC: 32.3 g/dL (ref 30.0–36.0)
MCV: 88.8 fL (ref 78.0–100.0)
Platelets: 288 10*3/uL (ref 150–400)
RBC: 3.49 MIL/uL — ABNORMAL LOW (ref 3.87–5.11)
RDW: 14.6 % (ref 11.5–15.5)
WBC: 18.7 10*3/uL — ABNORMAL HIGH (ref 4.0–10.5)

## 2015-05-07 LAB — TYPE AND SCREEN
ABO/RH(D): O NEG
Antibody Screen: NEGATIVE

## 2015-05-07 LAB — RPR: RPR Ser Ql: NONREACTIVE

## 2015-05-07 MED ORDER — ONDANSETRON HCL 4 MG/2ML IJ SOLN: 4.0000 mg | Freq: Four times a day (QID) | INTRAMUSCULAR | Status: DC | PRN

## 2015-05-07 MED ORDER — SODIUM CHLORIDE 0.9 % IV SOLN: 250.0000 mL | INTRAVENOUS | Status: DC | PRN

## 2015-05-07 MED ORDER — OXYCODONE-ACETAMINOPHEN 5-325 MG PO TABS
2.0000 | ORAL_TABLET | ORAL | Status: DC | PRN
Start: 2015-05-07 — End: 2015-05-07

## 2015-05-07 MED ORDER — DIBUCAINE 1 % RE OINT: 1.0000 "application " | TOPICAL_OINTMENT | RECTAL | Status: DC | PRN

## 2015-05-07 MED ORDER — SODIUM CHLORIDE 0.9 % IJ SOLN: 3.0000 mL | INTRAMUSCULAR | Status: DC | PRN

## 2015-05-07 MED ORDER — OXYTOCIN 40 UNITS IN LACTATED RINGERS INFUSION - SIMPLE MED: 62.5000 mL/h | INTRAVENOUS | Status: DC

## 2015-05-07 MED ORDER — DIPHENHYDRAMINE HCL 25 MG PO CAPS: 25.0000 mg | ORAL_CAPSULE | Freq: Four times a day (QID) | ORAL | Status: DC | PRN

## 2015-05-07 MED ORDER — ONDANSETRON HCL 4 MG/2ML IJ SOLN: 4.0000 mg | INTRAMUSCULAR | Status: DC | PRN

## 2015-05-07 MED ORDER — SENNOSIDES-DOCUSATE SODIUM 8.6-50 MG PO TABS
2.0000 | ORAL_TABLET | ORAL | Status: DC
  Administered 2015-05-08: 2 via ORAL
  Filled 2015-05-07: qty 2

## 2015-05-07 MED ORDER — LACTATED RINGERS IV SOLN: 500.0000 mL | INTRAVENOUS | Status: DC | PRN

## 2015-05-07 MED ORDER — IBUPROFEN 600 MG PO TABS
600.0000 mg | ORAL_TABLET | Freq: Four times a day (QID) | ORAL | Status: DC
  Administered 2015-05-07 – 2015-05-08 (×5): 600 mg via ORAL
  Filled 2015-05-07 (×5): qty 1

## 2015-05-07 MED ORDER — ACETAMINOPHEN 325 MG PO TABS
650.0000 mg | ORAL_TABLET | ORAL | Status: DC | PRN
  Administered 2015-05-07: 650 mg via ORAL
  Filled 2015-05-07: qty 2

## 2015-05-07 MED ORDER — CITRIC ACID-SODIUM CITRATE 334-500 MG/5ML PO SOLN: 30.0000 mL | ORAL | Status: DC | PRN

## 2015-05-07 MED ORDER — OXYTOCIN BOLUS FROM INFUSION: 500.0000 mL | INTRAVENOUS | Status: DC

## 2015-05-07 MED ORDER — WITCH HAZEL-GLYCERIN EX PADS: 1.0000 "application " | MEDICATED_PAD | CUTANEOUS | Status: DC | PRN

## 2015-05-07 MED ORDER — OXYTOCIN 10 UNIT/ML IJ SOLN
INTRAMUSCULAR | Status: AC
  Administered 2015-05-07: 10 [IU]
  Filled 2015-05-07: qty 1

## 2015-05-07 MED ORDER — OXYCODONE-ACETAMINOPHEN 5-325 MG PO TABS
1.0000 | ORAL_TABLET | ORAL | Status: DC | PRN
  Filled 2015-05-07: qty 1

## 2015-05-07 MED ORDER — ZOLPIDEM TARTRATE 5 MG PO TABS
5.0000 mg | ORAL_TABLET | Freq: Every evening | ORAL | Status: DC | PRN
Start: 2015-05-07 — End: 2015-05-08

## 2015-05-07 MED ORDER — OXYCODONE-ACETAMINOPHEN 5-325 MG PO TABS: 1.0000 | ORAL_TABLET | ORAL | Status: DC | PRN

## 2015-05-07 MED ORDER — ONDANSETRON HCL 4 MG PO TABS: 4.0000 mg | ORAL_TABLET | ORAL | Status: DC | PRN

## 2015-05-07 MED ORDER — LIDOCAINE HCL (PF) 1 % IJ SOLN
30.0000 mL | INTRAMUSCULAR | Status: DC | PRN
  Administered 2015-05-07: 30 mL via SUBCUTANEOUS
  Filled 2015-05-07: qty 30

## 2015-05-07 MED ORDER — TETANUS-DIPHTH-ACELL PERTUSSIS 5-2.5-18.5 LF-MCG/0.5 IM SUSP: 0.5000 mL | Freq: Once | INTRAMUSCULAR | Status: DC

## 2015-05-07 MED ORDER — BENZOCAINE-MENTHOL 20-0.5 % EX AERO: 1.0000 "application " | INHALATION_SPRAY | CUTANEOUS | Status: DC | PRN

## 2015-05-07 MED ORDER — SODIUM CHLORIDE 0.9 % IJ SOLN: 3.0000 mL | Freq: Two times a day (BID) | INTRAMUSCULAR | Status: DC

## 2015-05-07 MED ORDER — PRENATAL MULTIVITAMIN CH
1.0000 | ORAL_TABLET | Freq: Every day | ORAL | Status: DC
  Administered 2015-05-07: 1 via ORAL
  Filled 2015-05-07: qty 1

## 2015-05-07 MED ORDER — OXYCODONE-ACETAMINOPHEN 5-325 MG PO TABS: 2.0000 | ORAL_TABLET | ORAL | Status: DC | PRN

## 2015-05-07 MED ORDER — SIMETHICONE 80 MG PO CHEW
80.0000 mg | CHEWABLE_TABLET | ORAL | Status: DC | PRN
  Filled 2015-05-07: qty 1

## 2015-05-07 MED ORDER — LANOLIN HYDROUS EX OINT: TOPICAL_OINTMENT | CUTANEOUS | Status: DC | PRN

## 2015-05-07 NOTE — Lactation Note (Signed)
This note was copied from the chart of Tara Tarri AbernethySusanna Callaway. Lactation Consultation Note  Patient Name: Tara Vargas ZOXWR'UToday's Date: 05/07/2015 Reason for consult: Initial assessment;Breast/nipple pain (left nipple )  Baby is 12 hours old and has been to the breast several times , voided and stooled.  Mom is an exp. Breast feeder x 14 months.  @ consult baby spitty , bulb syringe used , color pink.  LC assisted mom latching cross cradle on the left breast  @ 1st mom was feeling discomfort and LC assisted to compress breast tissue and flipped  Upper lip and per mom comfortable. Baby was still feeding at 17 mins with multiply swallows,  Increased with intermittent breast compressions.  LC recommended and instructed mom on the use hand pump ( if needed to pre- pump after breast massage , hand express,  To make the junction of the nipple areola more compressible for a deeper latch, therefore enhancing comfort) shells  When not using the comfort gels for reverse pressure. [facility]  LC review hand expressing - steady flow of colostrum noted. Reminded mom to use on her nipples, Mother informed of post-discharge support and given phone number to the lactation department, including services for phone call assistance; out-patient appointments; and breastfeeding support group. List of other breastfeeding resources in the community given in the handout. Encouraged mother to call for problems or concerns related to breastfeeding.    Maternal Data Has patient been taught Hand Expression?: Yes (steady flow ) Does the patient have breastfeeding experience prior to this delivery?: Yes  Feeding Feeding Type: Breast Fed Length of feed:  (still feeding at 17 mins with swallowsincreased w / Breast compressions )  LATCH Score/Interventions Latch: Grasps breast easily, tongue down, lips flanged, rhythmical sucking.  Audible Swallowing: Spontaneous and  intermittent  Type of Nipple: Everted at rest and after stimulation  Comfort (Breast/Nipple): Filling, red/small blisters or bruises, mild/mod discomfort  Problem noted: Mild/Moderate discomfort (improved when upper lip flipped to flange position )  Hold (Positioning): Assistance needed to correctly position infant at breast and maintain latch. Intervention(s): Breastfeeding basics reviewed;Support Pillows;Position options;Skin to skin  LATCH Score: 8  Lactation Tools Discussed/Used Tools: Shells;Pump;Comfort gels Shell Type: Inverted Breast pump type: Manual WIC Program: No Pump Review: Setup, frequency, and cleaning Initiated by:: MAI  Date initiated:: 05/07/15   Consult Status Consult Status: Follow-up Date: 05/08/15 (check on left sore nipple ) Follow-up type: In-patient    Kathrin Greathouseorio, [name] 05/07/2015, 2:16 PM

## 2015-05-07 NOTE — H&P (Signed)
LABOR AND DELIVERY ADMISSION HISTORY AND PHYSICAL NOTE  [name] is a 29 y.o. female [phone] with IUP at 4645w5d by L/9 presenting for contractions. Began earlier this evening. Now very intense. She reports +FMs, No LOF, no VB, no blurry vision, headaches or peripheral edema, and RUQ pain.     Prenatal History/Complications:  Past Medical History: Past Medical History  Diagnosis Date  . Habitual aborter, antepartum   . Abnormal Pap smear of cervix     Repeat pap was negative  . Vaginal Pap smear, abnormal   . IBS (irritable bowel syndrome)     Past Surgical History: Past Surgical History  Procedure Laterality Date  . Wisdom tooth extraction      Obstetrical History: OB History    Gravida Para Term Preterm AB TAB SAB Ectopic Multiple Living   8 1 1  0 6 0 6 0  1      Social History: Social History   Social History  . Marital Status: Married    Spouse Name: N/A  . Number of Children: N/A  . Years of Education: N/A   Occupational History  . Deputy sheriff    Social History Main Topics  . Smoking status: Never Smoker   . Smokeless tobacco: Never Used  . Alcohol Use: No  . Drug Use: No  . Sexual Activity:    Partners: Male    Birth Control/ Protection: Condom, Abstinence, None   Other Topics Concern  . None   Social History Narrative   ** Merged History Encounter **        Family History: Family History  Problem Relation Age of Onset  . Diabetes Mother   . Diabetes Father   . Cancer - Lung Maternal Grandmother   . Cancer Paternal Grandmother     breast    Allergies: Allergies  Allergen Reactions  . Amoxicillin Hives  . Morphine And Related Hives    Prescriptions prior to admission  Medication Sig Dispense Refill Last Dose  . calcium carbonate (TUMS - DOSED IN MG ELEMENTAL CALCIUM) 500 MG chewable tablet Chew 2 tablets by mouth daily as needed for indigestion or heartburn.   05/05/2015 at Unknown time  . Prenatal Vit-Fe Fumarate-FA  (PRENATAL MULTIVITAMIN) TABS tablet Take 1 tablet by mouth daily at 12 noon.   05/05/2015 at Unknown time  . acidophilus (RISAQUAD) CAPS capsule Take 2 capsules by mouth daily.   Taking  . metroNIDAZOLE (FLAGYL) 500 MG tablet Take 1 tablet (500 mg total) by mouth 2 (two) times daily. 14 tablet 0 Taking  . Misc. Devices (BREAST PUMP) MISC Dispense one breast pump for patient 1 each 0   . Prenatal Vit-Fe Fumarate-FA (PRENATAL MULTIVITAMIN) TABS tablet Take 1 tablet by mouth daily.   Taking     Review of Systems   All systems reviewed and negative except as stated in HPI  Blood pressure 123/75, pulse 94, temperature 98.1 F (36.7 C), temperature source Oral, resp. rate 18, height 5\' 6"  (1.676 m), weight 225 lb (102.059 kg), last menstrual period 07/25/2014. General appearance: alert, cooperative, appears stated age and moderate distress Lungs: clear to auscultation bilaterally Heart: regular rate and rhythm Abdomen: soft, non-tender; bowel sounds normal Extremities: No calf swelling or tenderness Presentation: cephalic Fetal monitoring: 130 Uterine activity: q 3 min  Dilation: 4 Effacement (%): 80 Station: -2 Exam by:: Thressa ShellerHeather Hogan CNM   Prenatal labs: ABO, Rh: --/--/O NEG (08/03 1612) Antibody: NEG (08/03 1612) Rubella: !Error!immune RPR: NON REAC (09/02 0940)  HBsAg: NEGATIVE (04/13 1205)  HIV: NONREACTIVE (09/02 0940)  GBS:   neg 1 hr Glucola 76 Genetic screening  Quad neg Anatomy US  wnl  Prenatal Transfer Tool  Maternal Diabetes: No Genetic Screening: Normal Maternal Ultrasounds/Referrals: Normal Fetal Ultrasounds or other Referrals:  None Maternal Substance Abuse:  No Significant Maternal Medications:  None Significant Maternal Lab Results: Lab values include: Group B Strep negative  No results found for this or any previous visit (from the past 24 hour(s)).  Patient Active Problem List   Diagnosis Date Noted  . Hx of maternal laceration, 4th degree, currently  pregnant 05/03/2015  . Spotting affecting pregnancy in second trimester, antepartum 01/21/2015  . Rh negative, antepartum 01/21/2015  . Encounter for supervision of other normal pregnancy in first trimester 09/26/2014    Assessment: Tara Vargas is a 29 y.o. Z6X0960 at [redacted]w[redacted]d here for SOL.  #Labor: expectant #Pain: Water birth, on-call midwife is here #FWB:  normal FHR on intermittent monitor (patient refuses continuous) #ID:  gbs neg #MOF: breast #MOC: need to assess #Circ:  No #Rh negative: received rhogam 8/8; will plan to give PP  Tara Vargas 05/07/2015, 12:48 AM

## 2015-05-07 NOTE — H&P (Signed)
OBSTETRIC ADMISSION HISTORY AND PHYSICAL   Below History and Physical obtained by Medical student  Tara Vargas is a 29 y.o. female [phone] with IUP at 2052w5d by LMP presenting for contractions. Her contractions started around 6pm this evening and were about 10-12 minutes apart at that time. Since then they have increased in frequency and intensity occuring every 2-3 minutes, lasting for a minute each time. She denies being treated for any medical conditions during her pregnancy. She reports +FMs, No LOF, no VB, no blurry vision, headaches or peripheral edema. She denies any shortness of breath, chest pain or fevers/chills recently.  Dating: By LMP --->  Estimated Date of Delivery: 05/02/15  Sono:  01/30/15  @[redacted]w[redacted]d , CWD, normal anatomy, Cephalic presentation, 1278g, 45%81% EFW   Prenatal History/Complications:  Past Medical History: Past Medical History  Diagnosis Date  . Habitual aborter, antepartum   . Abnormal Pap smear of cervix     Repeat pap was negative  . Vaginal Pap smear, abnormal   . IBS (irritable bowel syndrome)     Past Surgical History: Past Surgical History  Procedure Laterality Date  . Wisdom tooth extraction      Obstetrical History: OB History    Gravida Para Term Preterm AB TAB SAB Ectopic Multiple Living   8 1 1  0 6 0 6 0  1      Social History: Social History   Social History  . Marital Status: Married    Spouse Name: N/A  . Number of Children: N/A  . Years of Education: N/A   Occupational History  . Deputy sheriff    Social History Main Topics  . Smoking status: Never Smoker   . Smokeless tobacco: Never Used  . Alcohol Use: No  . Drug Use: No  . Sexual Activity:    Partners: Male    Birth Control/ Protection: Condom, Abstinence, None   Other Topics Concern  . None   Social History Narrative   ** Merged History Encounter **        Family History: Family History  Problem Relation Age of Onset  . Diabetes Mother   . Diabetes  Father   . Cancer - Lung Maternal Grandmother   . Cancer Paternal Grandmother     breast    Allergies: Allergies  Allergen Reactions  . Amoxicillin Hives  . Morphine And Related Hives    Prescriptions prior to admission  Medication Sig Dispense Refill Last Dose  . calcium carbonate (TUMS - DOSED IN MG ELEMENTAL CALCIUM) 500 MG chewable tablet Chew 2 tablets by mouth daily as needed for indigestion or heartburn.   05/05/2015 at Unknown time  . Prenatal Vit-Fe Fumarate-FA (PRENATAL MULTIVITAMIN) TABS tablet Take 1 tablet by mouth daily at 12 noon.   05/05/2015 at Unknown time  . acidophilus (RISAQUAD) CAPS capsule Take 2 capsules by mouth daily.   Taking  . metroNIDAZOLE (FLAGYL) 500 MG tablet Take 1 tablet (500 mg total) by mouth 2 (two) times daily. 14 tablet 0 Taking  . Misc. Devices (BREAST PUMP) MISC Dispense one breast pump for patient 1 each 0   . Prenatal Vit-Fe Fumarate-FA (PRENATAL MULTIVITAMIN) TABS tablet Take 1 tablet by mouth daily.   Taking     Review of Systems   All systems reviewed and negative except as stated in HPI  Blood pressure 123/75, pulse 94, temperature 98.1 F (36.7 C), temperature source Oral, resp. rate 18, height 5\' 6"  (1.676 m), weight 102.059 kg (225 lb), last menstrual period  07/25/2014. General appearance: alert and lying in bed actively contracting being supported by duola  Lungs: clear to auscultation bilaterally Heart: regular rate and rhythm Abdomen: soft, non-tender; bowel sounds normal Pelvic: Deferred Extremities: Homans sign is negative, no sign of DVT Presentation: vertex Fetal monitoringBaseline: 135 bpm and Variability: Good {> 6 bpm) Uterine activityDate/time of onset: 11/22 at 6pm, Frequency: 2-3 minutes and Duration: 60 seconds Dilation: 4 Effacement (%): 80 Station: -2 Exam by:: Tara Vargas CNM   Prenatal labs: ABO, Rh: --/--/O NEG (08/03 1612) Antibody: NEG (08/03 1612) Rubella: !Error! RPR: NON REAC (09/02 0940)   HBsAg: NEGATIVE (04/13 1205)  HIV: NONREACTIVE (09/02 0940)  GBS:   Negative  1 hr Glucola- 76  Genetic screening  Normal  Anatomy US Normal  Prenatal Transfer Tool  Maternal Diabetes: No Genetic Screening: Normal Maternal Ultrasounds/Referrals: Normal Fetal Ultrasounds or other Referrals:  None Maternal Substance Abuse:  No Significant Maternal Medications:  None Significant Maternal Lab Results: None  No results found for this or any previous visit (from the past 24 hour(s)).  Patient Active Problem List   Diagnosis Date Noted  . Hx of maternal laceration, 4th degree, currently pregnant 05/03/2015  . Spotting affecting pregnancy in second trimester, antepartum 01/21/2015  . Rh negative, antepartum 01/21/2015  . Encounter for supervision of other normal pregnancy in first trimester 09/26/2014    Assessment: Tara Vargas is a 29 y.o. Z6X0960 at [redacted]w[redacted]d here for contractions and latent labor. Patient presented with contractions starting around 6pm that have increased in frequency and intensity. On exam this evening she was dilated to 4cm and during history and physical, her membranes ruptured. Patient will be admitted for water birth.   #Labor: admit for progression of labor, water birth service to deliver #Pain: Desires non-pharmacological treatment #FWB: moderate variability with normal heart rate with limited monitoring #ID:  GBS negative #MOF: Deferred due to progression of labor #MOC: Deferred due to progression of labor #Circ:  Deferred due to progression of labor  Tara Vargas 05/07/2015, 12:32 AM

## 2015-05-08 ENCOUNTER — Other Ambulatory Visit: Payer: Self-pay

## 2015-05-08 MED ORDER — DOCUSATE SODIUM 100 MG PO CAPS: 100.0000 mg | ORAL_CAPSULE | Freq: Two times a day (BID) | ORAL | Status: DC | PRN

## 2015-05-08 NOTE — Discharge Instructions (Signed)

## 2015-05-08 NOTE — Discharge Summary (Signed)
OB Discharge Summary     Patient Name: Tara Vargas DOB: December 22, 1985 MRN: [id number]  Date of admission: 05/06/2015 Delivering MD: Thressa Sheller D   Date of discharge: 05/08/2015  Admitting diagnosis: 40 WEEKS CTX Intrauterine pregnancy: [redacted]w[redacted]d     Secondary diagnosis:  Principal Problem:   SVD (spontaneous vaginal delivery) Active Problems:   Rh negative, antepartum   Hx of maternal laceration, 4th degree, currently pregnant   Active labor at term  Additional problems: Non     Discharge diagnosis: Term Pregnancy Delivered                                                                                                Post partum procedures:None  Augmentation: None  Complications: None  Hospital course:  Onset of Labor With Vaginal Delivery     29 y.o. yo [id number] at [redacted]w[redacted]d was admitted in Active Labor on 05/06/2015. Patient had an uncomplicated labor course with a waterbirth as follows:  Membrane Rupture Time/Date: 1:50 AM ,05/07/2015   Intrapartum Procedures: Episiotomy: None [1]                                         Lacerations:  2nd degree [3];Perineal [11]  Patient had a delivery of a Viable infant. 05/07/2015  Information for the patient's newborn:  Tara, Vargas [[id number]]  Delivery Method: Vag-Spont    Pateint had an uncomplicated postpartum course.Patient did not receive Rhogam postpartum as baby was found to be O negative. She is ambulating, tolerating a regular diet, passing flatus, and urinating well. Patient is discharged home in stable condition on 05/08/2015     Physical exam  Filed Vitals:   05/07/15 1015 05/07/15 1415 05/07/15 1904 05/08/15 0654  BP: 111/54 109/70 120/74 107/72  Pulse: 67 93 90 78  Temp: 98.6 F (37 C) 98.2 F (36.8 C) 98 F (36.7 C) 98.4 F (36.9 C)  TempSrc: Oral Oral Oral Oral  Resp: Height:      Weight:      SpO2: 100% 99%  98%   General: alert Lochia: appropriate Uterine Fundus:  firm Incision: N/A DVT Evaluation: No evidence of DVT seen on physical exam. No significant calf/ankle edema. Labs: Lab Results  Component Value Date   WBC 18.7* 05/07/2015   HGB 10.0* 05/07/2015   HCT 31.0* 05/07/2015   MCV 88.8 05/07/2015   PLT 288 05/07/2015   CMP Latest Ref Rng 01/30/2015  Glucose 65 - 99 mg/dL 74  BUN 6 - 20 mg/dL 8  Creatinine 1.30 - 8.65 mg/dL 7.84  Sodium 696 - 295 mmol/L 137  Potassium 3.5 - 5.1 mmol/L 3.7  Chloride 101 - 111 mmol/L 104  CO2 22 - 32 mmol/L 25  Calcium 8.9 - 10.3 mg/dL 2.8(U)  Total Protein 6.5 - 8.1 g/dL 6.8  Total Bilirubin 0.3 - 1.2 mg/dL 0.5  Alkaline Phos 38 - 126 U/L 68  AST 15 - 41 U/L 14(L)  ALT 14 - 54  U/L 13(L)    Discharge instruction: per After Visit Summary and "Baby and Me Booklet".  After visit meds:    Medication List    ASK your doctor about these medications        Breast Pump Misc  Dispense one breast pump for patient     calcium carbonate 500 MG chewable tablet  Commonly known as:  TUMS - dosed in mg elemental calcium  Chew 2 tablets by mouth daily as needed for indigestion or heartburn.     prenatal multivitamin Tabs tablet  Take 1 tablet by mouth daily at 12 noon.        Diet: routine diet  Activity: Advance as tolerated. Pelvic rest for 6 weeks.   Outpatient follow up:6 weeks Follow up Appt:Future Appointments Date Time Provider Department Center  05/08/2015 10:00 AM [facility] NURSE [facility]   Follow up Visit:No Follow-up on file.  Postpartum contraception: Undecided  Newborn Data: Live born female  Birth Weight: 9 lb 14.2 oz (4485 g) APGAR: 7, 9  Baby Feeding: Breast Disposition:home with mother   05/08/2015 Tara Angelene GiovanniZ Mikell, MD   OB FELLOW DISCHARGE ATTESTATION  I have seen and examined this patient and agree with above documentation in the resident's note.   Silvano BilisNoah B Logyn Kendrick, MD 11:49 AM

## 2015-05-14 ENCOUNTER — Encounter: Payer: Self-pay | Admitting: Obstetrics & Gynecology

## 2015-05-14 ENCOUNTER — Ambulatory Visit (INDEPENDENT_AMBULATORY_CARE_PROVIDER_SITE_OTHER): Payer: BLUE CROSS/BLUE SHIELD | Admitting: Obstetrics & Gynecology

## 2015-05-14 VITALS — BP 113/74 | Temp 97.9°F | Resp 16

## 2015-05-14 DIAGNOSIS — O9883 Other maternal infectious and parasitic diseases complicating the puerperium: Secondary | ICD-10-CM | POA: Diagnosis not present

## 2015-05-14 DIAGNOSIS — B3789 Other sites of candidiasis: Secondary | ICD-10-CM

## 2015-05-14 DIAGNOSIS — B49 Unspecified mycosis: Secondary | ICD-10-CM | POA: Diagnosis not present

## 2015-05-14 DIAGNOSIS — O9102 Infection of nipple associated with the puerperium: Principal | ICD-10-CM

## 2015-05-14 MED ORDER — AMBULATORY NON FORMULARY MEDICATION: Status: DC

## 2015-05-14 NOTE — Progress Notes (Signed)
   Subjective:    Patient ID: Tara Vargas, female    DOB: 1985/10/31, 29 y.o.   MRN: [id number]  HPI  Pt is a 29 yo G2P2 female s/p NSVD 1 week ago.  Pt having 2 days of nipple cracking and pain.  Pain is "pins and needles".  Pt has had pain like this after last delivery and was diagnosed with ductal yeast.  Child has small mount of oral thrush  and is seeing peds tomorrow.  Pt denies fever, rigor, chills.  Review of Systems  Constitutional: Negative for chills and fatigue.  Respiratory: Negative.   Cardiovascular: Negative.   Gastrointestinal: Negative.   Genitourinary: Negative.        Objective:   Physical Exam  Constitutional: She appears well-developed and well-nourished. No distress.  HENT:  Head: Normocephalic and atraumatic.  Eyes: Conjunctivae are normal.  Cardiovascular: Normal rate.   Pulmonary/Chest: Effort normal.            Assessment & Plan:  Ductal yeast  Compounded nipple cream Pt refuses diflucan Infant to peds for thrush treatment.

## 2015-05-16 ENCOUNTER — Ambulatory Visit (HOSPITAL_COMMUNITY)
Admission: RE | Admit: 2015-05-16 | Discharge: 2015-05-16 | Disposition: A | Discharge status: Home / Self Care | Payer: BLUE CROSS/BLUE SHIELD | Source: Ambulatory Visit | Attending: Obstetrics & Gynecology | Admitting: Obstetrics & Gynecology

## 2015-05-16 NOTE — Lactation Note (Signed)
Lactation Consult  Mother's reason for visit:  Nipple pain with nursing. Baby losing weight. Visit Type:  Outpatient - feeding assessment Appointment Notes:  Mom reports having cracked nipples by 4-5 days after delivery, started using #24 nipple shield which helped with pain, nipples healing. Mom was put on [facility] by midwife for discomfort on Monday 05/14/15. Mom thought baby was nursing well till f/u visit with Peds and noted that baby losing weight instead of gaining. Monday 05/13/15 weight was 8 lb. 12 oz, repeat weight on Wednesday was 8 lb. 11 oz. Baby Marin Comment now 89 days old.  Consult:  Initial Lactation Consultant:  Alfred Levins  ________________________________________________________________________    Baby's Name: Fernand Parkins Date of Birth: 05/07/2016 Pediatrician: Dr. Jill Alexanders, [facility] Gender: female Gestational Age: [redacted]w[redacted]d (At Birth) Birth Weight: 9 lb 14.2 oz (4485 g) Weight at Discharge: Weight: 9 lb 6.1 oz (4255 g)Date of Discharge: 05/08/2015 [facility] Weights   05/07/15 0211 05/07/15 2352  Weight: 9 lb 14.2 oz (4485 g) 9 lb 6.1 oz (4255 g)   [facility]: 05/15/15  8 lb. 11.0. Location:Pediatrician's office Weight today: 8 lb. 11.4 oz/3952gm.     ________________________________________________________________________  Mother's Name: Orlene Erm Tiznado Type of delivery:  SVB Breastfeeding Experience:  P2 BF 1st child for 14+ months Maternal Medical Conditions:  IBS, anemia Maternal Medications:  Colace, Probiotic, PNV, APNC  ________________________________________________________________________  Breastfeeding History (Post Discharge)  Frequency of breastfeeding:  10-12 times/day, lots of BF at night Duration of feeding:  15-45 minutes, 1 breast most feedings.   Patient does not supplement or pump.  Mom pumped for the 1st time this am and received 15 ml of EBM,  Using Spectra DEBP  Infant Intake and Output Assessment  Voids:  10 in 24 hrs.  Color:  Clear yellow Stools:  3 in 24 hrs.  Color:  Yellow  ________________________________________________________________________  Maternal Breast Assessment  Breast:  Soft Nipple:  Erect Pain level:  4-5 with initial latch improving to 1-2 with nursing Pain interventions:  All purpose nipple cream, nipple shield  _______________________________________________________________________ Feeding Assessment/Evaluation  Initial feeding assessment:  Infant's oral assessment:  Variance. Baby is noted to have short labial frenulum that extends past alveolar ridge to hard palate. Baby has short posterior lingual frenulum. Restricted tongue mobility upward, lateral and with extension. Cupping of the tongue noted. Baby's tongue thickens at the back of his mouth, some humping noted with suck exam, intermittent chewing at breast.   Positioning:  Cross cradle Left breast  LATCH documentation:  Mom latching with good technique on left breast, reviewed with Mom and demonstrated how to bring upper/lower lip down for more depth with latch without needing assist. Baby's lower lip will tuck intermittently during the feeding resulting in shallow latch requiring Mom to un-tuck the lip off/on. PS with initial latch was 5-6 but improved to 0 with baby nursing. Did not use nipple shield. Slight crease across Mom's nipple when baby came off the breast.    Suck assessment:  Both nutritive and non-nutritive suckling observed. Baby becomes sleepy at the breast requiring lots of stimulation to sustain a consistent suckling pattern.  Pre-feed weight:  3952 g  (8 lb. 11.4 oz.) Post-feed weight:  3992 g (8 lb. 12.8 oz.) Amount transferred:  40 ml  With nursing for 20 minutes   Additional Feeding Assessment -   Infant's oral assessment:  Variance  Positioning:  Football Right breast  LATCH documentation:  Mom latched baby  to right breast using #24 nipple shield, again the lips needed adjustment off/on throughout the feeding. Baby sustained more depth with nipple shield and Mom had PS=0. Baby sleepy and needing stimulation to sustain a suckling pattern. Milk present in the nipple shield at the end of the feeding but baby only transferred 6 ml with nursing for 17 minutes.  Took nipple shield off and Mom re-latched baby. Baby sleepy so nutritive/non-nutritive suckling observed. Lips needing adjustment off/on throughout the feeding to keep well flanged. Baby transferred 8 ml nursing without the nipple shield.   Tools:  Nipple shield 24 mm Instructed on use and cleaning of tool:  Yes.    Pre-feed weight:  3992 g  (8 lb. 12.8 oz.) Post-feed weight:  4006 g (8 lb. 13.3 oz.) Amount transferred:  14 ml  Total with both feedings from right breast.  Total amount pumped post feed:  R 5 ml    L 15 ml  Total amount transferred:  54 ml  Baby is having difficulty sustaining good depth at the breast and LC feels this could be due to restrictive labial/lingual frenulum. Mom is having continued discomfort with initial latch. Improvement with nipple shield but LC does not feel baby is transferring milk with the nipple shield as without. If this continues LC concerned Mom's milk supply will decrease. Due to baby losing some weight, Advised Mom to pre-pump with feedings thru 1 st milk ejection to remove lower fat milk so baby will get to higher fat milk with nursing.  BF 8-12 times and with feeding ques in 24 hours, keep baby nursing for 15-20 minutes both breasts each feeding or 30 minutes on 1 breast then switch to other breast to finish feeding. If Mom feels milk supply decreasing then advised to post pump few times during the day to protect milk supply.  Give baby back the milk Mom pre-pumped. Mom plans to use [employer] or Dr. Manson PasseyBrown #1 bottle with valve system to slow the flow of milk. Wake baby to BF during the day at least by 3 hours  to help baby get on schedule to sleep better at night. Information given to Mom to research frenotomy. Gave Mom contact information for Dr. Orland MustardMcMurtry in [place] to schedule evaluation if she and FOB desire. Mom will call back to schedule OP f/u. Advised if baby does have frenotomy we should see them again for feeding assessment. Encouraged to come to support group Monday or Tuesday of next week for weight check.

## 2015-05-22 ENCOUNTER — Encounter (INDEPENDENT_AMBULATORY_CARE_PROVIDER_SITE_OTHER): Payer: Self-pay | Admitting: *Deleted

## 2015-06-17 ENCOUNTER — Ambulatory Visit (INDEPENDENT_AMBULATORY_CARE_PROVIDER_SITE_OTHER): Payer: BLUE CROSS/BLUE SHIELD | Admitting: Family

## 2015-06-17 NOTE — Progress Notes (Signed)
Patient ID: Tara PulsSusanna P Vargas, female   DOB: 1986-05-06, 30 y.o.   MRN: [id number] Post Partum Exam  Tara PulsSusanna P Barton is a 30 y.o. [phone] female who presents for a postpartum visit. She is 5 weeks postpartum following a spontaneous vaginal delivery. I have fully reviewed the prenatal and intrapartum course. The delivery was at 2948w5d gestational weeks.  Anesthesia: none. Postpartum course has been unremarkable. Baby's course has been unremarkable except for URI and fever and [facility] Baby is feeding by breast. Bleeding no bleeding. Bowel function is normal. Bladder function is normal. Patient is not sexually active. Contraception method is undecided at this time.  Likely condoms.   Postpartum depression screening: Score=4.  "  The following portions of the patient's history were reviewed and updated as appropriate: allergies, current medications, past family history, past medical history, past social history, past surgical history and problem list.  Review of Systems Pertinent items are noted in HPI.   Objective:    BP 116/78 mmHg  Pulse 78  Resp 16  Ht 5\' 5"  (1.651 m)  Wt 211 lb (95.709 kg)  BMI 35.11 kg/m2  Breastfeeding? Yes  General:  alert, cooperative and appears stated age   Breasts:  inspection negative, no nipple discharge or bleeding, no masses or nodularity palpable  Lungs: clear to auscultation bilaterally  Heart:  regular rate and rhythm, S1, S2 normal, no murmur, click, rub or gallop  Abdomen: soft, non-tender; bowel sounds normal; no masses,  no organomegaly  Pelvic exam not indicated - not bleeding, no pain at vaginal area      Assessment:    Normal postpartum exam. Pap smear not done at today's visit.   Plan:    1. Contraception: condoms  Considering BTL.   2.  Follow up as needed.   Eino FarberWalidah Kennith GainN Karim, CNM

## 2016-03-11 IMAGING — US US OB TRANSVAGINAL
1 series · 14 of 28 positions shown · non-contrast
Comparison: None.

CLINICAL DATA: First trimester bleeding

EXAM:
OBSTETRIC <14 WK US AND TRANSVAGINAL OB US
TECHNIQUE: Both transabdominal and transvaginal ultrasound examinations were
performed for complete evaluation of the gestation as well as the
maternal uterus, adnexal regions, and pelvic cul-de-sac.
Transvaginal technique was performed to assess early pregnancy.

[Series 1: us ob comp less 14 wk · 14 of 30 slices shown]
[im 2/30]
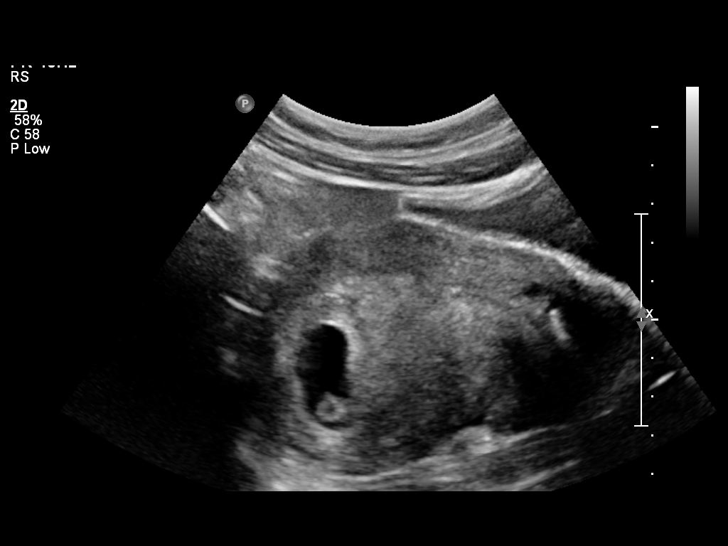
[im 4/30]
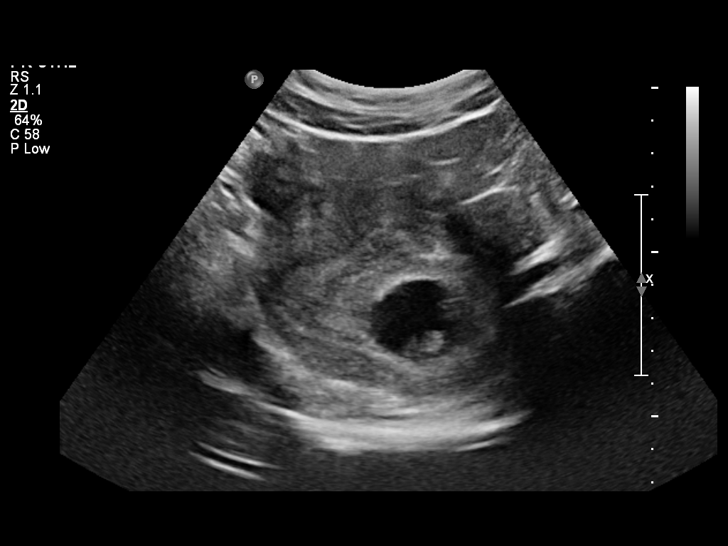
[im 6/30]
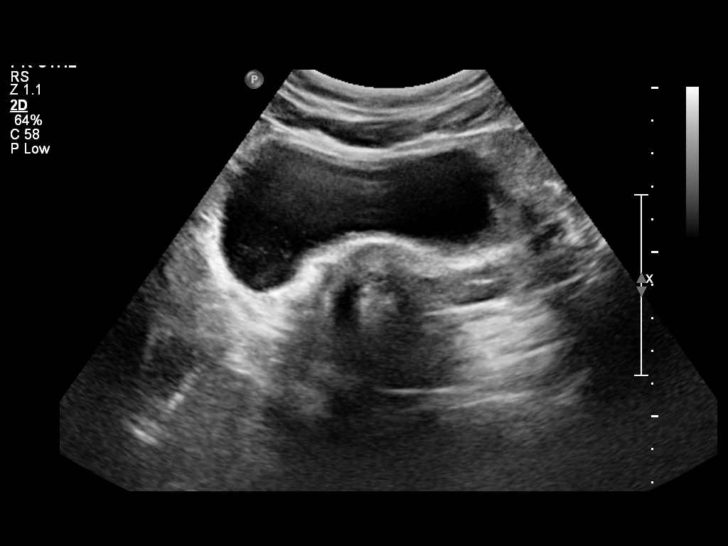
[im 8/30]
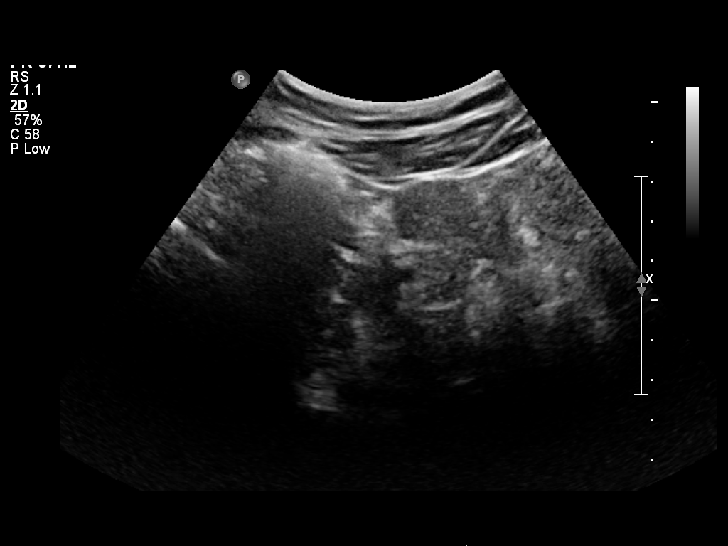
[im 10/30]
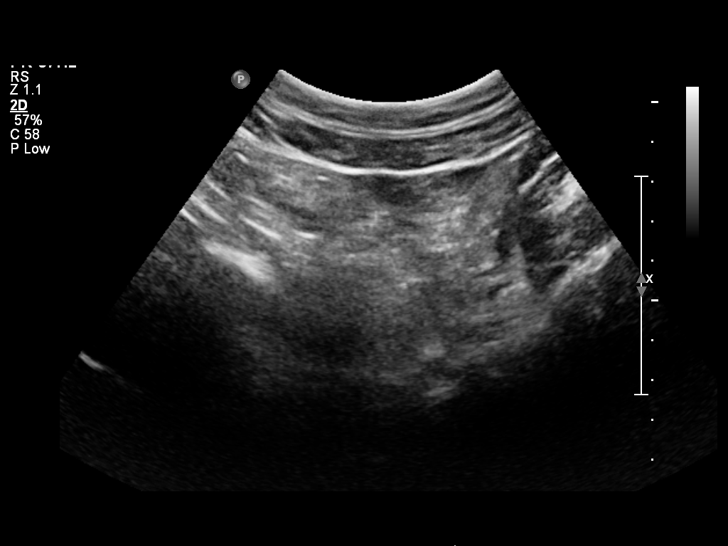
[im 12/30]
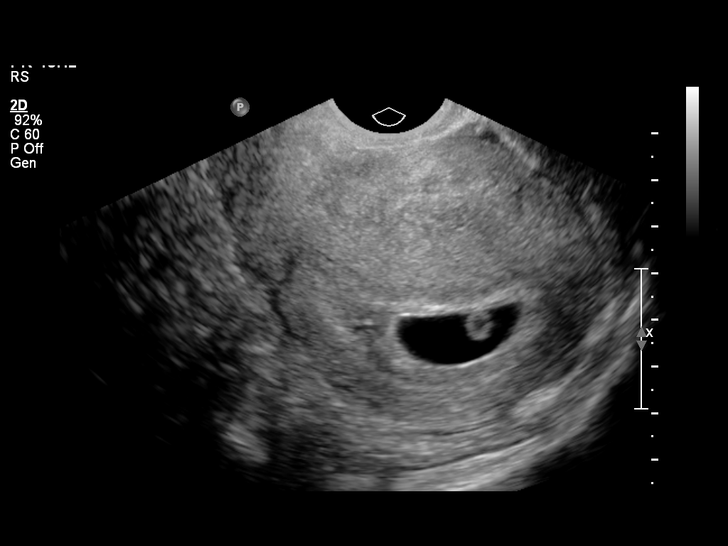
[im 14/30]
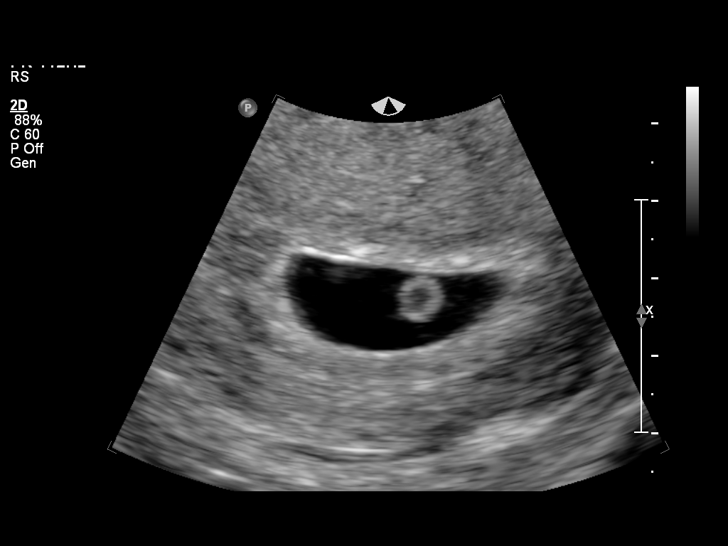
[im 17/30]
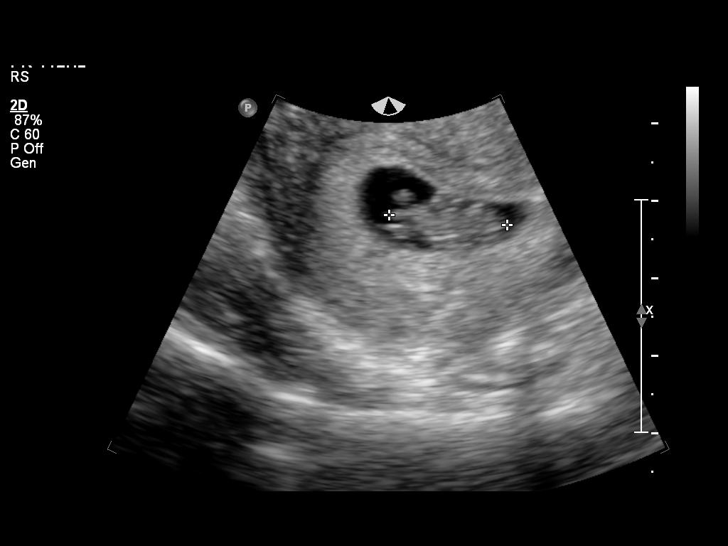
[im 19/30]
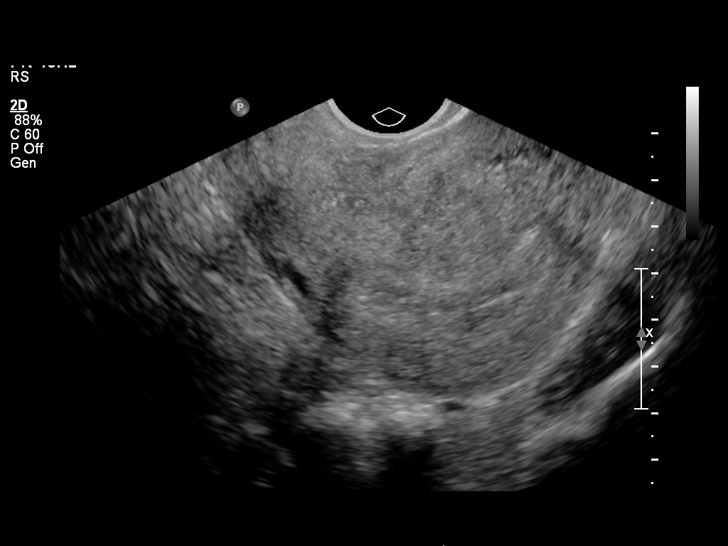
[im 21/30]
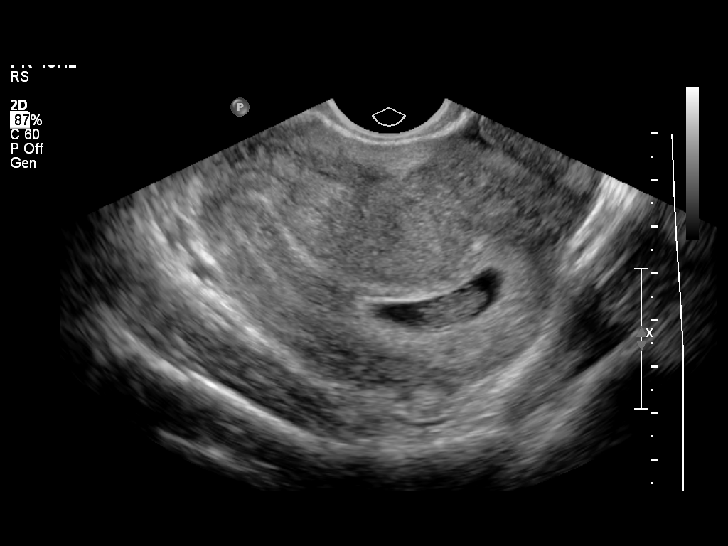
[im 23/30]
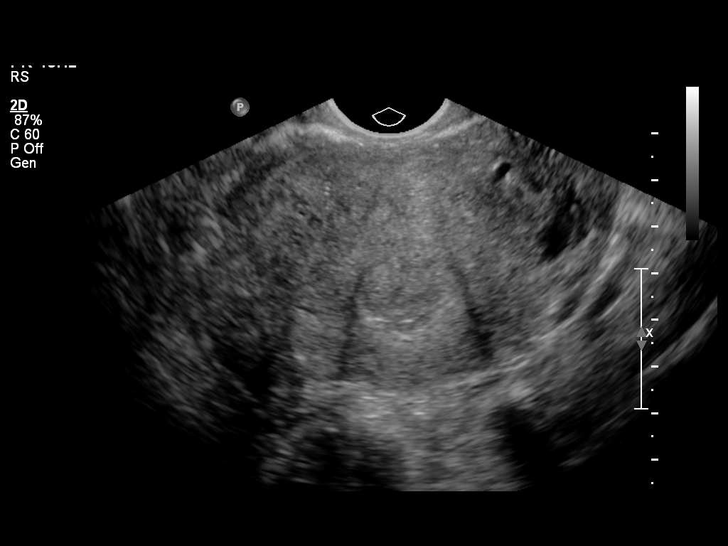
[im 25/30]
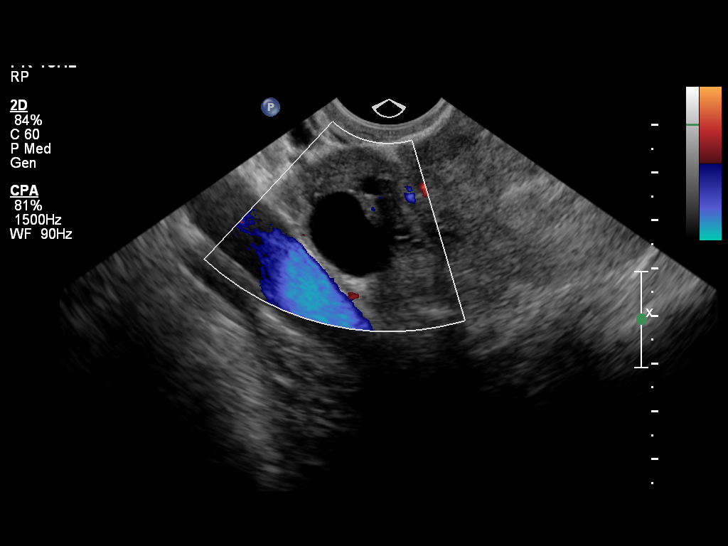
[im 27/30]
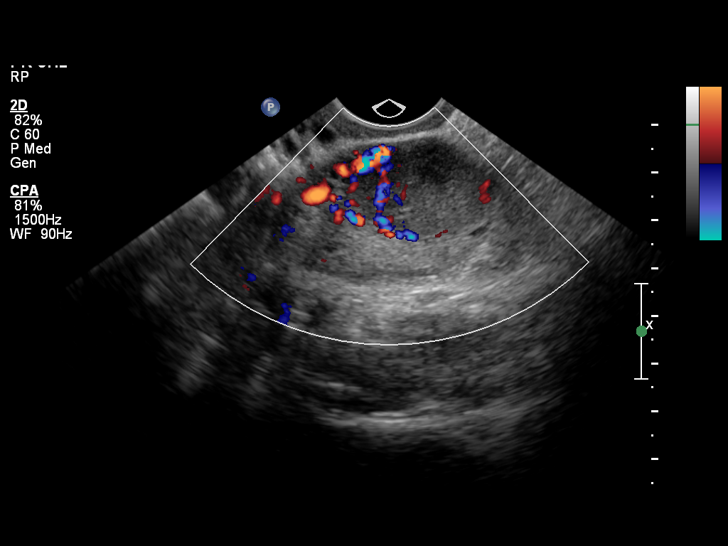
[im 30/30]
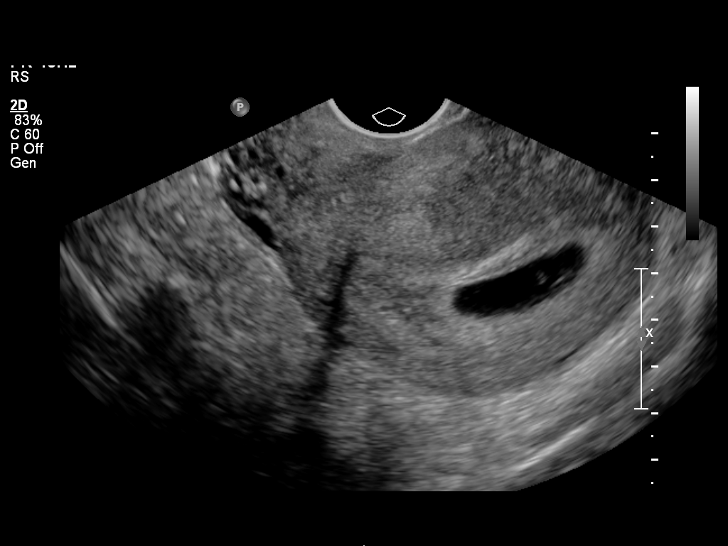

[14 of 28 positions shown; findings below may reference images not displayed]

FINDINGS: Intrauterine gestational sac: Visualized and normal in shape.

Yolk sac:  Present

Embryo:  Present

Cardiac Activity: Present

Heart Rate: 175  bpm

CRL:  14  mm   7 w   5 d                  US EDC: 05/06/2015.

Maternal uterus/adnexae:

Subchorionic hemorrhage: None

Right ovary: Normal

Left ovary: Normal

Other :None

Free fluid:  None
IMPRESSION: 1. Single living intrauterine gestation. The estimated gestational
age is 7 weeks and 5 days.
2. No complications identified.

## 2016-07-06 ENCOUNTER — Ambulatory Visit (INDEPENDENT_AMBULATORY_CARE_PROVIDER_SITE_OTHER): Payer: BLUE CROSS/BLUE SHIELD | Admitting: Obstetrics & Gynecology

## 2016-07-06 ENCOUNTER — Encounter: Payer: Self-pay | Admitting: Obstetrics & Gynecology

## 2016-07-06 VITALS — BP 125/85 | HR 91 | Ht 66.0 in | Wt 181.0 lb

## 2016-07-06 DIAGNOSIS — Z0000 Encounter for general adult medical examination without abnormal findings: Secondary | ICD-10-CM

## 2016-07-06 DIAGNOSIS — Z01419 Encounter for gynecological examination (general) (routine) without abnormal findings: Secondary | ICD-10-CM

## 2016-07-06 MED ORDER — ESCITALOPRAM OXALATE 10 MG PO TABS: 10.0000 mg | ORAL_TABLET | Freq: Every day | ORAL | 5 refills | Status: DC

## 2016-07-06 NOTE — Progress Notes (Signed)
Subjective:    Tara Vargas is a 31 y.o. MW P2 (32 yo daughter and 6214 month old son)female who presents for an annual exam. The patient has no complaints today. The patient is sexually active. GYN screening history: last pap: was normal. The patient wears seatbelts: yes. The patient participates in regular exercise: yes. Has the patient ever been transfused or tattooed?: yes. The patient reports that there is not domestic violence in her life. She has been feeling a lot of anxiety for the last 7 months, has tried meditation, counseling. She had a h/o PP depression. She denies HI/SI. She did not like prozac in the past.  Menstrual History: OB History    Gravida Para Term Preterm AB Living   8 2 2  0 6 1   SAB TAB Ectopic Multiple Live Births   6 0 0 0 1      Menarche age: [age] Patient's last menstrual period was 06/20/2016.    The following portions of the patient's history were reviewed and updated as appropriate: allergies, current medications, past family history, past medical history, past social history, past surgical history and problem list.  Review of Systems Pertinent items are noted in HPI.   Declines flu vaccine Married for 7 years, denies dyspareunia. Uses condoms and family planning.  Doula FH- + breast cancer pGM, no gyn or colon cancer   Objective:    BP 125/85   Pulse 91   Ht 5\' 6"  (1.676 m)   Wt 181 lb (82.1 kg)   LMP 06/20/2016   Breastfeeding? Yes   BMI 29.21 kg/m   General Appearance:    Alert, cooperative, no distress, appears stated age  Head:    Normocephalic, without obvious abnormality, atraumatic  Eyes:    PERRL, conjunctiva/corneas clear, EOM's intact, fundi    benign, both eyes  Ears:    Normal TM's and external ear canals, both ears  Nose:   Nares normal, septum midline, mucosa normal, no drainage    or sinus tenderness  Throat:   Lips, mucosa, and tongue normal; teeth and gums normal  Neck:   Supple, symmetrical, trachea midline, no adenopathy;     thyroid:  no enlargement/tenderness/nodules; no carotid   bruit or JVD  Back:     Symmetric, no curvature, ROM normal, no CVA tenderness  Lungs:     Clear to auscultation bilaterally, respirations unlabored  Chest Wall:    No tenderness or deformity   Heart:    Regular rate and rhythm, S1 and S2 normal, no murmur, rub   or gallop  Breast Exam:    No tenderness, masses, or nipple abnormality  Abdomen:     Soft, non-tender, bowel sounds active all four quadrants,    no masses, no organomegaly  Genitalia:    Normal female without lesion, discharge or tenderness, NSSA, NT, mobile, no adnexal tenderness or masses     Extremities:   Extremities normal, atraumatic, no cyanosis or edema  Pulses:   2+ and symmetric all extremities  Skin:   Skin color, texture, turgor normal, no rashes or lesions  Lymph nodes:   Cervical, supraclavicular, and axillary nodes normal  Neurologic:   CNII-XII intact, normal strength, sensation and reflexes    throughout  .    Assessment:    Healthy female exam.   Anxiety/depression   Plan:     Thin prep Pap smear. with cotesting lexapro 10 qhs, 20 mg prn

## 2016-07-08 LAB — CYTOLOGY - PAP
DIAGNOSIS: NEGATIVE
HPV (WINDOPATH): NOT DETECTED

## 2016-08-17 ENCOUNTER — Encounter: Payer: Self-pay | Admitting: Obstetrics & Gynecology

## 2016-08-17 ENCOUNTER — Ambulatory Visit (INDEPENDENT_AMBULATORY_CARE_PROVIDER_SITE_OTHER): Payer: BLUE CROSS/BLUE SHIELD | Admitting: Obstetrics & Gynecology

## 2016-08-17 VITALS — BP 123/87 | HR 74 | Resp 16 | Ht 66.0 in | Wt 178.0 lb

## 2016-08-17 DIAGNOSIS — N921 Excessive and frequent menstruation with irregular cycle: Secondary | ICD-10-CM

## 2016-08-17 MED ORDER — MEDROXYPROGESTERONE ACETATE 10 MG PO TABS: ORAL_TABLET | ORAL | 0 refills | Status: DC

## 2016-08-17 NOTE — Progress Notes (Signed)
Last 10 months have had regular cycles.  Last nml menses Feb 6th.  Bled 8 days, ended Feb 14th.  Started bleeding again Feb 21st.  Spotting and bleeding every day with heavy flow yesterday and cramps.

## 2016-08-17 NOTE — Addendum Note (Signed)
Addended by: Granville LewisLARK, LORA L on: 08/17/2016 02:32 PM   Modules accepted: Orders

## 2016-08-17 NOTE — Addendum Note (Signed)
Addended by: Granville LewisLARK, LORA L on: 08/17/2016 02:53 PM   Modules accepted: Orders

## 2016-08-17 NOTE — Addendum Note (Signed)
Addended by: Granville LewisLARK, Berlyn Saylor L on: 08/17/2016 02:48 PM   Modules accepted: Orders

## 2016-08-17 NOTE — Progress Notes (Signed)
   Subjective:    Patient ID: Tara Vargas, female    DOB: May 07, 1986, 31 y.o.   MRN: [id number]  HPI  Pt c/o irregular bleeding as follow:  Last 10 months have had regular cycles.  Last nml menses Feb 6th.  Bled 8 days, ended Feb 14th.  Started bleeding again Feb 21st.  Spotting and bleeding every day with heavy flow yesterday and cramps.  Pt has A history of recurrent miscarriages. She is unsure if she was pregnant but thinks she could have had a miscarriage and calls this bleeding. Patient is not taken a pregnancy test yet. Her urine pregnancy test here is negative.  She uses natural family planning for birth control.  Patient is still breast-feeding. She resumed her menses 8 weeks postpartum with exclusive breast-feeding  Review of Systems  Constitutional: Negative.   Respiratory: Negative.   Cardiovascular: Negative.   Gastrointestinal: Negative.   Endocrine: Negative.   Genitourinary: Positive for menstrual problem, pelvic pain and vaginal bleeding.  Psychiatric/Behavioral: Negative.        Objective:   Physical Exam  Constitutional: She is oriented to person, place, and time. She appears well-developed and well-nourished. No distress.  HENT:  Head: Normocephalic and atraumatic.  Eyes: Conjunctivae are normal.  Pulmonary/Chest: Effort normal.  Abdominal: Soft. Bowel sounds are normal. She exhibits no distension. There is no tenderness. There is no rebound.  Genitourinary:  Genitourinary Comments: Tanner 5 Vulva: No lesion Vagina: Pink normal rugate small amount of blood in the vault Cervix: Small amount of bloody oozing from the os. No polyp or lesion. Uterus small, retroverted, nontender Adnexa: No masses nontender  Musculoskeletal: She exhibits no edema.  Neurological: She is alert and oriented to person, place, and time.  Skin: Skin is warm and dry.  Psychiatric: She has a normal mood and affect.  Vitals reviewed.  Vitals:   08/17/16 1351  BP: 123/87  Pulse: 74    Resp: 16  Weight: 178 lb (80.7 kg)  Height: 5\' 6"  (1.676 m)    Assessment & Plan:  31 year old female with menometrorrhagia for 1 month.  We'll check TSH, CBC, beta hCG. Provera 10 mg. Start twice a day for 3 days then daily to finish 10 day course. If bleeding continues we'll consider transvaginal ultrasound or saline sono hysterogram Patient reassured and all questions answered

## 2016-08-18 ENCOUNTER — Telehealth: Payer: Self-pay | Admitting: *Deleted

## 2016-08-18 LAB — CBC
HEMATOCRIT: 40.2 % (ref 35.0–45.0)
Hemoglobin: 13.1 g/dL (ref 11.7–15.5)
MCH: 30 pg (ref 27.0–33.0)
MCHC: 32.6 g/dL (ref 32.0–36.0)
MCV: 92 fL (ref 80.0–100.0)
MPV: 10.5 fL (ref 7.5–12.5)
Platelets: 321 10*3/uL (ref 140–400)
RBC: 4.37 MIL/uL (ref 3.80–5.10)
RDW: 12.8 % (ref 11.0–15.0)
WBC: 6.9 10*3/uL (ref 3.8–10.8)

## 2016-08-18 LAB — HCG, QUANTITATIVE, PREGNANCY: hCG, Beta Chain, Quant, S: <2 m[IU]/mL

## 2016-08-18 NOTE — Telephone Encounter (Signed)
Pt notified of neg HCG level.  She has started her Provera and will call if bleeding does not stop after the med.

## 2016-08-19 LAB — TSH: TSH: 0.9 mIU/L

## 2016-10-13 ENCOUNTER — Encounter: Payer: Self-pay | Admitting: Obstetrics & Gynecology

## 2016-10-13 ENCOUNTER — Ambulatory Visit (INDEPENDENT_AMBULATORY_CARE_PROVIDER_SITE_OTHER): Payer: BLUE CROSS/BLUE SHIELD | Admitting: Obstetrics & Gynecology

## 2016-10-13 VITALS — BP 119/81 | HR 73 | Ht 66.0 in | Wt 187.0 lb

## 2016-10-13 DIAGNOSIS — Z3202 Encounter for pregnancy test, result negative: Secondary | ICD-10-CM

## 2016-10-13 DIAGNOSIS — R102 Pelvic and perineal pain: Secondary | ICD-10-CM

## 2016-10-13 LAB — POCT URINE PREGNANCY: PREG TEST UR: NEGATIVE

## 2016-10-13 NOTE — Progress Notes (Signed)
   Subjective:    Patient ID: Tara Vargas, female    DOB: 04-Jun-1986, 31 y.o.   MRN: [id number]  HPI  Pt presents for pelvic pain that feels like rhythmic contractions like she is in labor.  Pt not using contraception.  Pt no longer having bleeding that she presented for last month.  Pt has taken several home UPT that are negative.  Denies N/V/D.  No problems with urination  Review of Systems  Constitutional: Negative.   Respiratory: Negative.   Cardiovascular: Negative.   Gastrointestinal: Negative.   Genitourinary: Positive for pelvic pain.  Psychiatric/Behavioral: Negative.        Objective:   Physical Exam  Constitutional: She is oriented to person, place, and time. She appears well-developed and well-nourished. No distress.  HENT:  Head: Normocephalic and atraumatic.  Eyes: Conjunctivae are normal.  Pulmonary/Chest: Effort normal.  Abdominal: Soft. Bowel sounds are normal. She exhibits no distension. There is no tenderness.  Genitourinary: Vagina normal and uterus normal.  Genitourinary Comments: Adnexa is normal, no masses, nontender.  Musculoskeletal: She exhibits no edema.  Neurological: She is alert and oriented to person, place, and time.  Skin: Skin is warm and dry.  Psychiatric: She has a normal mood and affect.  Vitals reviewed.  Vitals:   10/13/16 1504  BP: 119/81  Pulse: 73  Weight: 187 lb (84.8 kg)  Height:  (1.676 m)    Assessment & Plan:  31 yo female presents with pelvic pain.  1-TVUS to evaluate pain 2-Continue PNV as having unprotected intercourse.

## 2016-10-14 ENCOUNTER — Ambulatory Visit (INDEPENDENT_AMBULATORY_CARE_PROVIDER_SITE_OTHER): Payer: BLUE CROSS/BLUE SHIELD

## 2016-10-14 DIAGNOSIS — N83201 Unspecified ovarian cyst, right side: Secondary | ICD-10-CM

## 2016-10-14 DIAGNOSIS — R102 Pelvic and perineal pain: Secondary | ICD-10-CM

## 2016-10-16 ENCOUNTER — Telehealth: Payer: Self-pay | Admitting: *Deleted

## 2016-10-16 NOTE — Telephone Encounter (Signed)
Pt notified of U/S report having a hemorrhagic cyst.  Pt will call mid July for a repeat U/S.

## 2016-10-16 NOTE — Telephone Encounter (Signed)
-----   Message from Lesly DukesKelly H Leggett, MD sent at 10/15/2016  4:40 PM EDT ----- Pt has a hemorrhagic cyst.  This is a benign condition.  It should self resolve.  Recommend repeat US in 6-8 weeks.

## 2017-03-25 ENCOUNTER — Ambulatory Visit: Payer: Self-pay | Admitting: Obstetrics & Gynecology

## 2017-05-25 ENCOUNTER — Ambulatory Visit: Payer: Self-pay | Admitting: Obstetrics and Gynecology

## 2017-06-10 ENCOUNTER — Encounter: Payer: Self-pay | Admitting: Obstetrics & Gynecology

## 2017-06-10 ENCOUNTER — Ambulatory Visit (INDEPENDENT_AMBULATORY_CARE_PROVIDER_SITE_OTHER): Payer: BLUE CROSS/BLUE SHIELD | Admitting: Obstetrics & Gynecology

## 2017-06-10 VITALS — BP 116/70 | HR 69 | Resp 16 | Ht 66.0 in | Wt 205.0 lb

## 2017-06-10 DIAGNOSIS — R635 Abnormal weight gain: Secondary | ICD-10-CM

## 2017-06-10 DIAGNOSIS — N940 Mittelschmerz: Secondary | ICD-10-CM | POA: Diagnosis not present

## 2017-06-10 MED ORDER — NORGESTREL-ETHINYL ESTRADIOL 0.3-30 MG-MCG PO TABS: 1.0000 | ORAL_TABLET | Freq: Every day | ORAL | 11 refills | Status: AC

## 2017-06-10 NOTE — Progress Notes (Signed)
Patient ID: Tara Vargas, female   DOB: 1986/02/22, 31 y.o.   MRN: [id number]  Chief Complaint  Patient presents with  . Pelvic Pain    Rt pelvic pain    HPI Tara Vargas is a 31 y.o. female. Married P2 here with the issue of ovulation pain and bleeding. She is no longer trying to get pregnant. She had an u/s 5/18 that showed a 4 cm hemorrhagic cyst. HPI  Past Medical History:  Diagnosis Date  . Abnormal Pap smear of cervix    Repeat pap was negative  . Anxiety   . Habitual aborter, antepartum   . IBS (irritable bowel syndrome)   . Vaginal Pap smear, abnormal     Past Surgical History:  Procedure Laterality Date  . WISDOM TOOTH EXTRACTION      Family History  Problem Relation Age of Onset  . Diabetes Mother   . Diabetes Father   . Cancer - Lung Maternal Grandmother   . Cancer Paternal Grandmother        breast    Social History Social History   Tobacco Use  . Smoking status: Never Smoker  . Smokeless tobacco: Never Used  Substance Use Topics  . Alcohol use: Yes    Alcohol/week: 0.0 oz    Comment: occ  . 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  Allergies  Allergen Reactions  . Morphine Itching and Rash  . Amoxicillin Hives and Nausea And Vomiting    Has patient had a PCN reaction causing immediate rash, facial/tongue/throat swelling, SOB or lightheadedness with hypotension: Yes Has patient had a PCN reaction causing severe rash involving mucus membranes or skin necrosis:no Has patient had a PCN reaction that required hospitalization No Has patient had a PCN reaction occurring within the last 10 years: Yes If all of the above answers are "NO", then may proceed with Cephalosporin use. Has patient had a PCN reaction causing immediate rash, facial/tongue/throat swelling, SOB or lightheadedness with hypotension: Yes Has patient had a PCN reaction causing severe rash involving mucus membranes or skin necrosis:no Has patient had a PCN reaction  that required hospitalization No Has patient had a PCN reaction occurring within the last 10 years: Yes If all of the above answers are "NO", then may proceed with Cephalosporin use.  Marland Kitchen. Morphine And Related Hives  . Neosporin [Neomycin-Bacitracin Zn-Polymyx]     Current Outpatient Medications  Medication Sig Dispense Refill  . escitalopram (LEXAPRO) 10 MG tablet Take 10 mg by mouth daily.     No current facility-administered medications for this visit.     Review of Systems Review of Systems  Blood pressure 116/70, pulse 69, resp. rate 16, height 5\' 6"  (1.676 m), weight 205 lb (93 kg), last menstrual period 05/19/2017, currently breastfeeding.  Physical Exam Physical Exam Well nourished, well hydrated White female, no apparent distress Breathing, conversing, and ambulating normally Abd benign bimnaual exam-  Normal female without lesion, discharge or tenderness, normal size and shape, anteverted, normal adnexal exam   Data Reviewed IMPRESSION: 3.4 cm hemorrhagic right ovarian cyst.  Otherwise unremarkable study.   Assessment    Weight gain- check TSH, she declines a referral to a nutritionist Ovulation pain and bleeding- she declines CT/GC testing, willing to try OCPs (has used them in the past) She declines another gyn u/s    Plan    See above       Charvis Lightner C Devan Babino 06/10/2017, 2:14 PM

## 2017-06-11 LAB — TSH: TSH: 0.51 m[IU]/L

## 2017-07-12 ENCOUNTER — Encounter: Payer: Self-pay | Admitting: Obstetrics & Gynecology

## 2017-10-04 ENCOUNTER — Ambulatory Visit (INDEPENDENT_AMBULATORY_CARE_PROVIDER_SITE_OTHER): Payer: BLUE CROSS/BLUE SHIELD | Admitting: Obstetrics & Gynecology

## 2017-10-04 ENCOUNTER — Encounter: Payer: Self-pay | Admitting: Obstetrics & Gynecology

## 2017-10-04 DIAGNOSIS — Z01419 Encounter for gynecological examination (general) (routine) without abnormal findings: Secondary | ICD-10-CM

## 2017-10-04 DIAGNOSIS — N393 Stress incontinence (female) (male): Secondary | ICD-10-CM

## 2017-10-04 MED ORDER — ESCITALOPRAM OXALATE 10 MG PO TABS: 10.0000 mg | ORAL_TABLET | Freq: Every day | ORAL | 5 refills | Status: AC

## 2017-10-04 NOTE — Progress Notes (Signed)
Subjective:    Tara Vargas is a 32 y.o. married P2 (2 1/2 yo and 57 yo kids) female who presents for an annual exam. The patient has no complaints today. She is happy with the OCPs. Bukt she is concerned about her weight gain, very happy with anxiety. She also has issue of GSUI.The patient is sexually active. GYN screening history: last pap: was normal. The patient wears seatbelts: yes. The patient participates in regular exercise: yes. (walking about 3-5 miles 3-4 times per week)  Has the patient ever been transfused or tattooed?: yes. The patient reports that there is not domestic violence in her life.   Menstrual History: OB History    Gravida  8   Para  2   Term  2   Preterm  0   AB  6   Living  1     SAB  6   TAB  0   Ectopic  0   Multiple  0   Live Births  1           Menarche age: 19 Patient's last menstrual period was 09/30/2017.    The following portions of the patient's history were reviewed and updated as appropriate: allergies, current medications, past family history, past medical history, past social history, past surgical history and problem list.  Review of Systems Pertinent items are noted in HPI.   FH- + breast cancer in paternal GM, no gyn or colon cancer Married for 8 years, denies dyspareunia Does not want more kids Works as a Biomedical scientist, on call all the time   Objective:    BP 117/78   Pulse 92   Ht 5\' 5"  (1.651 m)   Wt 213 lb (96.6 kg)   LMP 09/30/2017   BMI 35.45 kg/m   General Appearance:    Alert, cooperative, no distress, appears stated age  Head:    Normocephalic, without obvious abnormality, atraumatic  Eyes:    PERRL, conjunctiva/corneas clear, EOM's intact, fundi    benign, both eyes  Ears:    Normal TM's and external ear canals, both ears  Nose:   Nares normal, septum midline, mucosa normal, no drainage    or sinus tenderness  Throat:   Lips, mucosa, and tongue normal; teeth and gums normal  Neck:   Supple, symmetrical,  trachea midline, no adenopathy;    thyroid:  no enlargement/tenderness/nodules; no carotid   bruit or JVD  Back:     Symmetric, no curvature, ROM normal, no CVA tenderness  Lungs:     Clear to auscultation bilaterally, respirations unlabored  Chest Wall:    No tenderness or deformity   Heart:    Regular rate and rhythm, S1 and S2 normal, no murmur, rub   or gallop  Breast Exam:    No tenderness, masses, or nipple abnormality  Abdomen:     Soft, non-tender, bowel sounds active all four quadrants,    no masses, no organomegaly  Genitalia:    Normal female without lesion, discharge or tenderness, mild prolapse, nothing extraordinary     Extremities:   Extremities normal, atraumatic, no cyanosis or edema  Pulses:   2+ and symmetric all extremities  Skin:   Skin color, texture, turgor normal, no rashes or lesions  Lymph nodes:   Cervical, supraclavicular, and axillary nodes normal  Neurologic:   CNII-XII intact, normal strength, sensation and reflexes    throughout  .    Assessment:    Healthy female exam.   Weight  management GSUI   Plan:     Referral to urology and nutriton Recommend Kegel exercises Refill Lexapro
